# Patient Record
Sex: Female | Born: 1985 | Hispanic: Yes | Marital: Single | Smoking: Never smoker
Health system: Southern US, Community
[De-identification: ages and names within clinical notes are randomized; demographics above are authoritative.]

## PROBLEM LIST (undated history)

## (undated) ENCOUNTER — Inpatient Hospital Stay: Payer: Self-pay

## (undated) DIAGNOSIS — F329 Major depressive disorder, single episode, unspecified: Secondary | ICD-10-CM

## (undated) DIAGNOSIS — O99213 Obesity complicating pregnancy, third trimester: Secondary | ICD-10-CM

## (undated) DIAGNOSIS — Z8759 Personal history of other complications of pregnancy, childbirth and the puerperium: Secondary | ICD-10-CM

## (undated) DIAGNOSIS — F32A Depression, unspecified: Secondary | ICD-10-CM

## (undated) HISTORY — DX: Personal history of other complications of pregnancy, childbirth and the puerperium: Z87.59

## (undated) HISTORY — DX: Depression, unspecified: F32.A

## (undated) HISTORY — DX: Major depressive disorder, single episode, unspecified: F32.9

---
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
## 2006-01-04 ENCOUNTER — Ambulatory Visit: Payer: Self-pay | Admitting: Family Medicine

## 2012-01-29 ENCOUNTER — Emergency Department: Payer: Self-pay | Admitting: Emergency Medicine

## 2012-01-29 LAB — LIPASE, BLOOD: Lipase: 146 U/L (ref 73–393)

## 2012-01-29 LAB — URINALYSIS, COMPLETE
Bilirubin,UR: NEGATIVE
Glucose,UR: NEGATIVE mg/dL (ref 0–75)
Ketone: NEGATIVE
RBC,UR: 40 /HPF (ref 0–5)
Squamous Epithelial: NONE SEEN
WBC UR: 489 /HPF (ref 0–5)

## 2012-01-29 LAB — COMPREHENSIVE METABOLIC PANEL
Albumin: 3.3 g/dL — ABNORMAL LOW (ref 3.4–5.0)
Anion Gap: 7 (ref 7–16)
BUN: 13 mg/dL (ref 7–18)
Bilirubin,Total: 0.4 mg/dL (ref 0.2–1.0)
Chloride: 107 mmol/L (ref 98–107)
Creatinine: 0.81 mg/dL (ref 0.60–1.30)
Potassium: 4 mmol/L (ref 3.5–5.1)
SGOT(AST): 19 U/L (ref 15–37)
SGPT (ALT): 17 U/L
Total Protein: 8 g/dL (ref 6.4–8.2)

## 2012-01-29 LAB — CBC
HCT: 37.8 % (ref 35.0–47.0)
MCH: 29.2 pg (ref 26.0–34.0)
RBC: 4.16 10*6/uL (ref 3.80–5.20)
RDW: 14.3 % (ref 11.5–14.5)
WBC: 13.1 10*3/uL — ABNORMAL HIGH (ref 3.6–11.0)

## 2012-01-29 LAB — PREGNANCY, URINE: Pregnancy Test, Urine: NEGATIVE m[IU]/mL

## 2012-01-31 ENCOUNTER — Emergency Department: Payer: Self-pay | Admitting: Unknown Physician Specialty

## 2012-01-31 LAB — URINALYSIS, COMPLETE
Ketone: NEGATIVE
Nitrite: NEGATIVE
Ph: 6 (ref 4.5–8.0)
WBC UR: 31 /HPF (ref 0–5)

## 2012-01-31 LAB — CBC
HCT: 40.2 % (ref 35.0–47.0)
MCH: 28.9 pg (ref 26.0–34.0)
MCV: 90 fL (ref 80–100)
Platelet: 286 10*3/uL (ref 150–440)
RBC: 4.45 10*6/uL (ref 3.80–5.20)
WBC: 11.9 10*3/uL — ABNORMAL HIGH (ref 3.6–11.0)

## 2012-01-31 LAB — WET PREP, GENITAL

## 2012-01-31 LAB — COMPREHENSIVE METABOLIC PANEL
Albumin: 3.2 g/dL — ABNORMAL LOW (ref 3.4–5.0)
Bilirubin,Total: 0.5 mg/dL (ref 0.2–1.0)
Calcium, Total: 8.4 mg/dL — ABNORMAL LOW (ref 8.5–10.1)
Creatinine: 0.67 mg/dL (ref 0.60–1.30)
EGFR (African American): >60
EGFR (Non-African Amer.): >60
Potassium: 3.7 mmol/L (ref 3.5–5.1)
SGOT(AST): 20 U/L (ref 15–37)
SGPT (ALT): 19 U/L
Sodium: 137 mmol/L (ref 136–145)
Total Protein: 8.4 g/dL — ABNORMAL HIGH (ref 6.4–8.2)

## 2012-02-02 LAB — URINE CULTURE

## 2012-02-06 LAB — CULTURE, BLOOD (SINGLE)

## 2014-08-31 NOTE — L&D Delivery Note (Signed)
Delivery Summary for [facility]  Labor Events:   Preterm labor:   Rupture date:   Rupture time:   Rupture type: Artificial  Fluid Color: Clear  Induction:   Augmentation:   Complications:   Cervical ripening:          Delivery:   Episiotomy:   Lacerations:   Repair suture:   Repair # of packets:   Blood loss (ml): 200   Information for the patient's newborn:  Garnell, Begeman [[id number]]    Delivery 03/27/2015 1:27 PM by  VBAC, Spontaneous Sex:  female Gestational Age: [redacted]w[redacted]d Delivery Clinician:  Hildred Laser Living?: Yes        APGARS  One minute Five minutes Ten minutes  Skin color: 1   1      Heart rate: 2   2      Grimace: 2   2      Muscle tone: 1   2      Breathing: 2   2      Totals: 8  9      Presentation/position: Vertex  Right Occiput Anterior Resuscitation: None  Cord information: 3 vessels   Disposition of cord blood:     Blood gases sent?  Complications:   Placenta: Delivered: 03/27/2015 1:32 PM  Spontaneous  Intact appearance Newborn Measurements: Weight: 5 lb 15.9 oz (2720 g)  Height: 18.5"  Head circumference:    Chest circumference:    Other providers:    Additional  information: Forceps:   Vacuum:   Breech:   Observed anomalies       Delivery Summary for [facility]  Labor Events:   Preterm labor:   Rupture date:   Rupture time:   Rupture type: Artificial  Fluid Color: Clear  Induction:   Augmentation:   Complications:   Cervical ripening:          Delivery:   Episiotomy:   Lacerations:   Repair suture:   Repair # of packets:   Blood loss (ml): 200   Information for the patient's newborn:  Jeliyah, [place] [[id number]]    Delivery 03/27/2015 1:27 PM by  VBAC, Spontaneous Sex:  female Gestational Age: [redacted]w[redacted]d Delivery Clinician:  Hildred Laser Living?: Yes        APGARS  One minute Five minutes Ten minutes  Skin color: 1   1      Heart rate: 2   2      Grimace: 2   2       Muscle tone: 1   2      Breathing: 2   2      Totals: 8  9      Presentation/position: Vertex  Right Occiput Anterior Resuscitation: None  Cord information: 3 vessels   Disposition of cord blood:     Blood gases sent?  Complications:   Placenta: Delivered: 03/27/2015 1:32 PM  Spontaneous  Intact appearance Newborn Measurements: Weight: 5 lb 15.9 oz (2720 g)  Height: 18.5"  Head circumference:    Chest circumference:    Other providers:    Additional  information: Forceps:   Vacuum:   Breech:   Observed anomalies        Delivery Summary for [facility]  Labor Events:   Preterm labor:   Rupture date:   Rupture time:   Rupture type: Artificial  Fluid Color: Clear  Induction:   Augmentation:   Complications:   Cervical ripening:  Delivery:   Episiotomy:   Lacerations:   Repair suture:   Repair # of packets:   Blood loss (ml): 200   Information for the patient's newborn:  Diania, Co [[id number]]    Delivery 03/27/2015 1:27 PM by  VBAC, Spontaneous Sex:  female Gestational Age: [redacted]w[redacted]d Delivery Clinician:  Hildred Laser Living?: Yes        APGARS  One minute Five minutes Ten minutes  Skin color: 1   1      Heart rate: 2   2      Grimace: 2   2      Muscle tone: 1   2      Breathing: 2   2      Totals: 8  9      Presentation/position: Vertex  Right Occiput Anterior Resuscitation: None  Cord information: 3 vessels   Disposition of cord blood:     Blood gases sent?  Complications:   Placenta: Delivered: 03/27/2015 1:32 PM  Spontaneous  Intact appearance Newborn Measurements: Weight: 5 lb 15.9 oz (2720 g)  Height: 18.5"  Head circumference:    Chest circumference:    Other providers:    Additional  information: Forceps:   Vacuum:   Breech:   Observed anomalies         Delivery Note At 1:27 PM a viable female was delivered via VBAC, Spontaneous (Presentation: Right Occiput Anterior).  APGAR: 8, 9; weight 5 lb 15.9  oz (2720 g).   Placenta status: Intact, Spontaneous.  Cord: 3 vessels with the following complications: none.  Cord pH: not obtained  Anesthesia: Other  Episiotomy:   Lacerations:  None Suture Repair: None Est. Blood Loss (mL): 200  Mom to postpartum.  Baby to Couplet care / Skin to Skin.    Hildred Laser 03/27/2015, 7:39 PM

## 2014-09-26 LAB — OB RESULTS CONSOLE HGB/HCT, BLOOD: Hemoglobin: 12.3 g/dL

## 2014-09-27 LAB — OB RESULTS CONSOLE VARICELLA ZOSTER ANTIBODY, IGG: Varicella: IMMUNE

## 2014-09-27 LAB — OB RESULTS CONSOLE HIV ANTIBODY (ROUTINE TESTING): HIV: NONREACTIVE

## 2014-09-27 LAB — OB RESULTS CONSOLE ABO/RH: RH Type: POSITIVE

## 2014-09-27 LAB — OB RESULTS CONSOLE RUBELLA ANTIBODY, IGM: Rubella: IMMUNE

## 2014-09-27 LAB — OB RESULTS CONSOLE HEPATITIS B SURFACE ANTIGEN: Hepatitis B Surface Ag: NEGATIVE

## 2014-09-28 ENCOUNTER — Ambulatory Visit: Payer: Self-pay | Admitting: Advanced Practice Midwife

## 2014-10-02 LAB — OB RESULTS CONSOLE GC/CHLAMYDIA
Chlamydia: NEGATIVE
Gonorrhea: NEGATIVE

## 2014-11-20 ENCOUNTER — Ambulatory Visit: Payer: Self-pay | Admitting: Advanced Practice Midwife

## 2014-12-24 ENCOUNTER — Other Ambulatory Visit: Payer: Self-pay | Admitting: Advanced Practice Midwife

## 2014-12-24 DIAGNOSIS — O0992 Supervision of high risk pregnancy, unspecified, second trimester: Secondary | ICD-10-CM

## 2015-01-10 LAB — OB RESULTS CONSOLE RPR: RPR: NONREACTIVE

## 2015-01-11 ENCOUNTER — Other Ambulatory Visit: Payer: Self-pay | Admitting: Advanced Practice Midwife

## 2015-01-11 DIAGNOSIS — E669 Obesity, unspecified: Secondary | ICD-10-CM

## 2015-01-11 DIAGNOSIS — Z8759 Personal history of other complications of pregnancy, childbirth and the puerperium: Secondary | ICD-10-CM

## 2015-01-14 ENCOUNTER — Ambulatory Visit
Admission: RE | Admit: 2015-01-14 | Discharge: 2015-01-14 | Disposition: A | Discharge status: Home / Self Care | Payer: Self-pay | Source: Ambulatory Visit | Attending: Advanced Practice Midwife | Admitting: Advanced Practice Midwife

## 2015-01-14 DIAGNOSIS — Z8759 Personal history of other complications of pregnancy, childbirth and the puerperium: Secondary | ICD-10-CM

## 2015-01-14 DIAGNOSIS — O0992 Supervision of high risk pregnancy, unspecified, second trimester: Secondary | ICD-10-CM | POA: Insufficient documentation

## 2015-01-14 DIAGNOSIS — E669 Obesity, unspecified: Secondary | ICD-10-CM

## 2015-02-04 ENCOUNTER — Other Ambulatory Visit: Payer: Self-pay | Admitting: Advanced Practice Midwife

## 2015-02-04 ENCOUNTER — Observation Stay
Admission: RE | Admit: 2015-02-04 | Discharge: 2015-02-04 | Disposition: A | Discharge status: Home / Self Care | Payer: Self-pay | Source: Ambulatory Visit | Attending: Obstetrics and Gynecology | Admitting: Obstetrics and Gynecology

## 2015-02-04 ENCOUNTER — Ambulatory Visit: Admission: RE | Admit: 2015-02-04 | Payer: Self-pay | Source: Ambulatory Visit | Admitting: Obstetrics and Gynecology

## 2015-02-04 DIAGNOSIS — O3421 Maternal care for scar from previous cesarean delivery: Secondary | ICD-10-CM | POA: Insufficient documentation

## 2015-02-04 DIAGNOSIS — O365930 Maternal care for other known or suspected poor fetal growth, third trimester, not applicable or unspecified: Principal | ICD-10-CM | POA: Insufficient documentation

## 2015-02-04 DIAGNOSIS — O99210 Obesity complicating pregnancy, unspecified trimester: Secondary | ICD-10-CM

## 2015-02-04 DIAGNOSIS — IMO0002 Reserved for concepts with insufficient information to code with codable children: Secondary | ICD-10-CM

## 2015-02-04 DIAGNOSIS — Z3689 Encounter for other specified antenatal screening: Secondary | ICD-10-CM

## 2015-02-04 DIAGNOSIS — Z3A00 Weeks of gestation of pregnancy not specified: Secondary | ICD-10-CM | POA: Insufficient documentation

## 2015-02-04 HISTORY — DX: Obesity complicating pregnancy, third trimester: O99.213

## 2015-02-04 NOTE — Progress Notes (Signed)
Spoke with Dr. Greggory Keenefrancesco about reactive NST. G4P3, 30 weeks and 4 days, EDC 04/10/15, baseline 130, 15x15, accels, no decels, CAT 1, reactive strip.  MD ordered pt to be discharge with weekly NSTs.

## 2015-02-04 NOTE — Discharge Instructions (Signed)
Cardiotocografa en reposo (Nonstress Test)  La cardiotocografa en reposo es un procedimiento que controla los latidos del corazn del feto. El estudio controla los latidos del corazn cuando el feto est en reposo y [place] Ranchmientras se [place] Springsmueve. En un feto sano, habr un aumento de la frecuencia cardaca fetal cuando se mueve o patea. La frecuencia cardaca disminuye en reposo. Este examen ayuda a determinar si el feto est sano. El mdico va a revisar una serie de patrones en el ritmo cardaco para asegurarse de que el beb est creciendo bien. Si hay alguna preocupacin, el mdico puede ordenar pruebas adicionales o puede sugerir otro curso de accin. Este estudio se hace en el tercer trimestre del embarazo y puede ayudar a determinar si es seguro y Passenger transport managernecesario realizar un parto anticipado. Las razones ms comunes para Paediatric nursehacer este examen son:   Ya ha pasado la fecha del [place]parto.  Tiene un embarazo de Conservator, museum/galleryalto riesgo.  Siente menos movimientos que lo habitual.  Ha perdido un embarazo en el pasado.  El mdico sospecha problemas en el desarrollo fetal.  Tiene mucho o muy poco lquido amnitico. ANTES DEL PROCEDIMIENTO   Coma un alimento justo antes del examen o como se lo indique su mdico. Los alimentos ayudan a Risk managerestimular los movimientos fetales.  Vaya al bao justo antes del [place]angle[place] Parkestudio. PROCEDIMIENTO   Le colocarn dos cinturones alrededor del abdomen. Estos cinturones tienen monitores conectados a ellos. Uno registra la frecuencia cardaca fetal y el otro las contracciones uterinas.  Le indicarn que se acueste de lado o permanezca sentada en posicin vertical.  Le darn un pulsador para presionar cuando sienta el movimiento.  Se escucharn los latidos cardacos del feto y se observar en una pantalla. Los latidos cardacos se registran en un papel.  Si el feto parece estar dormido, se le puede pedir que beba un poco de jugo o soda, presione suavemente su abdomen, o haga algo de ruido para  despertarlo. DESPUS DEL PROCEDIMIENTO  El mdico le Hershey Companyexplicar los resultados del estudio y le har recomendaciones para el futuro prximo.  Document Released: 08/17/2005 Document Revised: 01/01/2014 [facility] Patient Information 2015 [place]ExitCare, [place]. This information is not intended to replace advice given to you by your health care provider. Make sure you discuss any questions you have with your health care provider.

## 2015-02-04 NOTE — H&P (Signed)
Jenny Brooks is a 29 y.o. female presenting for NST. History OB History    Gravida Para Term Preterm AB TAB SAB Ectopic Multiple Living   4 3 3  0 0 0 0 0 0 0     Past Medical History  Diagnosis Date  . Medical history non-contributory   . Obesity affecting pregnancy in third trimester    Past Surgical History  Procedure Laterality Date  . Cesarean section    . No past surgeries     Family History: family history includes Heart disease in her father. Social History:  reports that she has never smoked. She does not have any smokeless tobacco history on file. She reports that she does not drink alcohol or use illicit drugs.   Prenatal Transfer Tool  Maternal Diabetes: No Maternal Substance Abuse:  No Significant Maternal Medications:  None Significant Maternal Lab Results:  None Other Comments:  A/P Testing for Obesity  ROS    Blood pressure 105/59, pulse 45, temperature 98.3 F (36.8 C), temperature source Oral, resp. rate 18, height 5\' 3"  (1.6 m), weight 100.245 kg (221 lb), last menstrual period 07/25/2014. Exam Physical Exam   NST INTERPRETATION:  Indications: Obesity  Mode: External Baseline Rate (A): 138 bpm Variability: Moderate Accelerations: 15 x 15 Decelerations: None Nonstress Test Interpretation: Reactive Overall Impression: Reassuring for gestational age Contraction Frequency (min): none   Impression: reactive   Plan:  1. Weekly NST 2. Routine Prenatal care at [facility].    Herold HarmsMartin A Defrancesco, MD Prenatal labs: ABO, Rh:   Antibody:   Rubella:   RPR:    HBsAg:    HIV:    GBS:     Assessment/Plan:  Plan:  1. Weekly NST 2. Routine Prenatal care at [facility].   Daphine DeutscherMartin A Defrancesco 02/04/2015, 4:04 PM

## 2015-02-11 ENCOUNTER — Ambulatory Visit
Admission: RE | Admit: 2015-02-11 | Discharge: 2015-02-11 | Disposition: A | Discharge status: Home / Self Care | Payer: Self-pay | Source: Ambulatory Visit | Attending: Advanced Practice Midwife | Admitting: Advanced Practice Midwife

## 2015-02-11 ENCOUNTER — Observation Stay
Admission: RE | Admit: 2015-02-11 | Discharge: 2015-02-11 | Disposition: A | Discharge status: Home / Self Care | Payer: Self-pay | Attending: Obstetrics and Gynecology | Admitting: Obstetrics and Gynecology

## 2015-02-11 ENCOUNTER — Encounter: Payer: Self-pay | Admitting: *Deleted

## 2015-02-11 DIAGNOSIS — Z3A36 36 weeks gestation of pregnancy: Secondary | ICD-10-CM | POA: Insufficient documentation

## 2015-02-11 DIAGNOSIS — Z36 Encounter for antenatal screening of mother: Secondary | ICD-10-CM | POA: Insufficient documentation

## 2015-02-11 DIAGNOSIS — O365990 Maternal care for other known or suspected poor fetal growth, unspecified trimester, not applicable or unspecified: Secondary | ICD-10-CM | POA: Insufficient documentation

## 2015-02-11 DIAGNOSIS — IMO0002 Reserved for concepts with insufficient information to code with codable children: Secondary | ICD-10-CM

## 2015-02-11 MED ORDER — ACETAMINOPHEN 325 MG PO TABS: 650.0000 mg | ORAL_TABLET | ORAL | Status: DC | PRN

## 2015-02-11 NOTE — Discharge Instructions (Signed)
Restriccin del crecimiento intrauterino (Intrauterine Growth Restriction) Se refiere al crecimiento deficiente de un beb en un momento del embarazo o al nacer. No debe confundirse con "pequeo para la edad gestacional", que significa que el peso del beb al nacer est en el extremo ms bajo) menos del 10% de la curva de pesos normales.  CAUSAS Problemas mdicos en la madre:  Presin arterial elevada.  Enfermedad renal, pulmonar o cardaca.  Diabetes con arteriosclerosis.  Hemoglobinopatas, enfermedades de [employer].  Sndrome de anticuerpos antifosfolpidos, un trastorno del sistema inmunolgico. Otras causas:  Consumo de drogas, hbito de fumar e ingesta de alcohol excesiva.  Enfermedades de la placenta.  Embarazo de gemelos o ms bebs.  Desnutricin  Infecciones.  Problemas genticos.  Embarazo a los 16 aos o menos, o en mujeres de ms de 35 aos.  Exposicin a sustancias qumicas. SNTOMAS  tero ms pequeo que lo normal, al medir su tamao sobre el abdomen.  Si al medir por medio de una ecografa de la circunferencia ceflica del feto, la circunferencia abdominal, el dimetro del rea biparietal (lados) y el largo del fmur se obtienen valores menores a los normales, hay indicios de restriccin del desarrollo intrauterino. RIESGOS Y COMPLICACIONES  Muerte fetal dentro del tero o beb nacido muerto.  Insuficiencia de lquido amnitico en la bolsa del beb (oligohidramnios).  Problemas en la frecuencia cardaca fetal. Esto ocasiona ms partos por cesrea.  Bajas puntuaciones de Apgar (evaluacin del estado del beb en el momento del nacimiento).  Aumento en la acidez de la sangre del beb (acidosis). Los nios de bajo peso al nacer tambin pueden sufrir las siguientes complicaciones:  Bajo nivel de Banker.  Aumento de la bilirrubina.  Baja Arts development officer (hipotermia).  Bajas puntuaciones de Apgar, convulsiones, muerte fetal y [employer]  al [employer]. TRATAMIENTO  Controles y seguimiento estrechos del feto durante el [place].  Tratamiento del las infecciones, si existieran.  Tratamiento y control de las enfermedades existentes.  Observacin del estado del feto con pruebas de no estrs, pruebas de estrs por contracciones y perfil biofsico del feto.  La ecografa Doppler (medicin del flujo de la arteria umbilical) disminuye el riesgo de muerte fetal y [employer] al Psychologist, clinical, permitiendo un parto anticipado en caso de ser anormal. INSTRUCCIONES PARA EL CUIDADO DOMICILIARIO  Siga los consejos y las instrucciones de su mdico, y cumpla con todos los controles prenatales.  Descanse y duerma lo suficiente.  Consuma una dieta balanceada y tome todas sus suplementos de vitaminas y minerales.  No haga un uso excesivo de su energa en actividades fsicas, Aleen Campi o actividades domsticas extenuantes.  No practique actividad fsica excepto que el mdico la autorice.  No fume, no beba alcohol, ni consuma drogas.  Evite el contacto con sustancias qumicas, como pesticidas. SOLICITE ATENCIN MDICA SI:  La temperatura se eleva por encima de 100 F (37.9 C).  No siente los movimientos del beb o estos son menos frecuentes.  Tiene una prdida de lquido por la vagina.  Presenta una hemorragia vaginal abundante.  Siente dolor abdominal.  Tiene contracciones uterinas. Document Released: 07/30/2008 Document Revised: 11/09/2011 [facility] Patient Information 2015 [place], [place]. This information is not intended to replace advice given to you by your health care provider. Make sure you discuss any questions you have with your health care provider.

## 2015-02-11 NOTE — OB Triage Note (Signed)
Present for Scheduled NST 

## 2015-02-18 ENCOUNTER — Observation Stay
Admission: RE | Admit: 2015-02-18 | Discharge: 2015-02-18 | Disposition: A | Discharge status: Home / Self Care | Payer: Self-pay | Attending: Obstetrics and Gynecology | Admitting: Obstetrics and Gynecology

## 2015-02-18 ENCOUNTER — Encounter: Payer: Self-pay | Admitting: *Deleted

## 2015-02-18 DIAGNOSIS — O99210 Obesity complicating pregnancy, unspecified trimester: Principal | ICD-10-CM | POA: Insufficient documentation

## 2015-02-18 NOTE — Discharge Instructions (Signed)
Return to Labor and delivery next Monday for NST

## 2015-02-18 NOTE — OB Triage Note (Addendum)
Pt here for NST due to high risk pregnancy related to prior c/s and obesity. Hx of SGA babies    Dr. Greggory Keen on unit, strip reveiwed. Pt to be dc'ed to home.

## 2015-02-19 NOTE — Progress Notes (Signed)
NST INTERPRETATION:  Indications: Obesity; prior C-section  Mode: External Baseline Rate (A): 130 bpm Variability: Moderate Accelerations: 15 x 15 Decelerations: None     Impression: reactive NST  Plan:  1.  Fetal well-being documented. 2.  Fetal kick counts twice a day. 3.  Continue weekly NST's.  Herold Harms, MD

## 2015-03-06 ENCOUNTER — Other Ambulatory Visit: Payer: Self-pay | Admitting: Advanced Practice Midwife

## 2015-03-06 DIAGNOSIS — IMO0002 Reserved for concepts with insufficient information to code with codable children: Secondary | ICD-10-CM

## 2015-03-06 LAB — OB RESULTS CONSOLE GBS: GBS: NEGATIVE

## 2015-03-06 LAB — OB RESULTS CONSOLE GC/CHLAMYDIA
Chlamydia: NEGATIVE
GC PROBE AMP, GENITAL: NEGATIVE

## 2015-03-11 ENCOUNTER — Observation Stay
Admission: RE | Admit: 2015-03-11 | Discharge: 2015-03-11 | Disposition: A | Discharge status: Home / Self Care | Payer: Self-pay | Attending: Obstetrics and Gynecology | Admitting: Obstetrics and Gynecology

## 2015-03-11 ENCOUNTER — Ambulatory Visit
Admission: RE | Admit: 2015-03-11 | Discharge: 2015-03-11 | Disposition: A | Discharge status: Home / Self Care | Payer: MEDICAID | Source: Ambulatory Visit | Attending: Advanced Practice Midwife | Admitting: Advanced Practice Midwife

## 2015-03-11 DIAGNOSIS — Z3689 Encounter for other specified antenatal screening: Secondary | ICD-10-CM

## 2015-03-11 DIAGNOSIS — Z3A35 35 weeks gestation of pregnancy: Secondary | ICD-10-CM | POA: Insufficient documentation

## 2015-03-11 DIAGNOSIS — IMO0002 Reserved for concepts with insufficient information to code with codable children: Secondary | ICD-10-CM

## 2015-03-11 DIAGNOSIS — Z3493 Encounter for supervision of normal pregnancy, unspecified, third trimester: Principal | ICD-10-CM | POA: Insufficient documentation

## 2015-03-11 DIAGNOSIS — Z3A34 34 weeks gestation of pregnancy: Secondary | ICD-10-CM | POA: Insufficient documentation

## 2015-03-13 ENCOUNTER — Other Ambulatory Visit: Payer: Self-pay

## 2015-03-13 ENCOUNTER — Other Ambulatory Visit: Payer: Self-pay | Admitting: Advanced Practice Midwife

## 2015-03-13 DIAGNOSIS — O288 Other abnormal findings on antenatal screening of mother: Secondary | ICD-10-CM

## 2015-03-18 ENCOUNTER — Ambulatory Visit
Admission: RE | Admit: 2015-03-18 | Discharge: 2015-03-18 | Disposition: A | Discharge status: Home / Self Care | Payer: MEDICAID | Source: Ambulatory Visit | Attending: Advanced Practice Midwife | Admitting: Advanced Practice Midwife

## 2015-03-18 DIAGNOSIS — O288 Other abnormal findings on antenatal screening of mother: Secondary | ICD-10-CM

## 2015-03-18 DIAGNOSIS — Z36 Encounter for antenatal screening of mother: Secondary | ICD-10-CM | POA: Insufficient documentation

## 2015-03-18 DIAGNOSIS — Z3A34 34 weeks gestation of pregnancy: Secondary | ICD-10-CM | POA: Insufficient documentation

## 2015-03-21 ENCOUNTER — Encounter: Payer: Self-pay | Admitting: Obstetrics and Gynecology

## 2015-03-27 ENCOUNTER — Inpatient Hospital Stay
Admission: EM | Admit: 2015-03-27 | Discharge: 2015-03-29 | DRG: 775 | Disposition: A | Discharge status: Home / Self Care | Payer: Medicaid Other | Attending: Obstetrics and Gynecology | Admitting: Obstetrics and Gynecology

## 2015-03-27 ENCOUNTER — Encounter: Payer: Self-pay | Admitting: Obstetrics and Gynecology

## 2015-03-27 DIAGNOSIS — Z6838 Body mass index (BMI) 38.0-38.9, adult: Secondary | ICD-10-CM

## 2015-03-27 DIAGNOSIS — O3421 Maternal care for scar from previous cesarean delivery: Principal | ICD-10-CM | POA: Diagnosis present

## 2015-03-27 DIAGNOSIS — Z3483 Encounter for supervision of other normal pregnancy, third trimester: Secondary | ICD-10-CM

## 2015-03-27 DIAGNOSIS — E669 Obesity, unspecified: Secondary | ICD-10-CM | POA: Diagnosis present

## 2015-03-27 DIAGNOSIS — Z3A38 38 weeks gestation of pregnancy: Secondary | ICD-10-CM | POA: Diagnosis present

## 2015-03-27 DIAGNOSIS — Z8249 Family history of ischemic heart disease and other diseases of the circulatory system: Secondary | ICD-10-CM

## 2015-03-27 DIAGNOSIS — O34219 Maternal care for unspecified type scar from previous cesarean delivery: Secondary | ICD-10-CM

## 2015-03-27 DIAGNOSIS — O99210 Obesity complicating pregnancy, unspecified trimester: Secondary | ICD-10-CM | POA: Diagnosis present

## 2015-03-27 LAB — CBC
HCT: 40 % (ref 35.0–47.0)
Hemoglobin: 12.9 g/dL (ref 12.0–16.0)
MCH: 28.5 pg (ref 26.0–34.0)
MCHC: 32.1 g/dL (ref 32.0–36.0)
MCV: 88.5 fL (ref 80.0–100.0)
PLATELETS: 367 10*3/uL (ref 150–440)
RBC: 4.52 MIL/uL (ref 3.80–5.20)
RDW: 15.3 % — ABNORMAL HIGH (ref 11.5–14.5)
WBC: 13.2 10*3/uL — AB (ref 3.6–11.0)

## 2015-03-27 LAB — TYPE AND SCREEN
ABO/RH(D): O POS
Antibody Screen: NEGATIVE

## 2015-03-27 LAB — ABO/RH: ABO/RH(D): O POS

## 2015-03-27 MED ORDER — MISOPROSTOL 200 MCG PO TABS
ORAL_TABLET | ORAL | Status: AC
  Filled 2015-03-27: qty 4

## 2015-03-27 MED ORDER — DOCUSATE SODIUM 100 MG PO CAPS
100.0000 mg | ORAL_CAPSULE | Freq: Two times a day (BID) | ORAL | Status: DC
  Administered 2015-03-28 – 2015-03-29 (×3): 100 mg via ORAL
  Filled 2015-03-27 (×3): qty 1

## 2015-03-27 MED ORDER — LACTATED RINGERS IV SOLN: 500.0000 mL | INTRAVENOUS | Status: DC | PRN

## 2015-03-27 MED ORDER — PRENATAL MULTIVITAMIN CH
1.0000 | ORAL_TABLET | Freq: Every day | ORAL | Status: DC
  Administered 2015-03-28 – 2015-03-29 (×2): 1 via ORAL
  Filled 2015-03-27 (×2): qty 1

## 2015-03-27 MED ORDER — WITCH HAZEL-GLYCERIN EX PADS: 1.0000 "application " | MEDICATED_PAD | CUTANEOUS | Status: DC | PRN

## 2015-03-27 MED ORDER — OXYTOCIN 40 UNITS IN LACTATED RINGERS INFUSION - SIMPLE MED
INTRAVENOUS | Status: AC
  Administered 2015-03-27: 40 [IU]
  Filled 2015-03-27: qty 1000

## 2015-03-27 MED ORDER — LACTATED RINGERS IV SOLN
INTRAVENOUS | Status: DC
  Administered 2015-03-27: 10:00:00 via INTRAVENOUS

## 2015-03-27 MED ORDER — AMMONIA AROMATIC IN INHA
RESPIRATORY_TRACT | Status: AC
  Filled 2015-03-27: qty 10

## 2015-03-27 MED ORDER — LACTATED RINGERS IV SOLN: INTRAVENOUS | Status: DC

## 2015-03-27 MED ORDER — ONDANSETRON HCL 4 MG/2ML IJ SOLN: 4.0000 mg | INTRAMUSCULAR | Status: DC | PRN

## 2015-03-27 MED ORDER — OXYTOCIN 40 UNITS IN LACTATED RINGERS INFUSION - SIMPLE MED
62.5000 mL/h | INTRAVENOUS | Status: DC
  Administered 2015-03-28: 62.5 mL/h via INTRAVENOUS

## 2015-03-27 MED ORDER — LIDOCAINE HCL (PF) 1 % IJ SOLN
INTRAMUSCULAR | Status: AC
  Filled 2015-03-27: qty 30

## 2015-03-27 MED ORDER — OXYTOCIN BOLUS FROM INFUSION
500.0000 mL | INTRAVENOUS | Status: DC
Start: 2015-03-27 — End: 2015-03-28

## 2015-03-27 MED ORDER — SIMETHICONE 80 MG PO CHEW: 80.0000 mg | CHEWABLE_TABLET | ORAL | Status: DC | PRN

## 2015-03-27 MED ORDER — ACETAMINOPHEN 325 MG PO TABS
650.0000 mg | ORAL_TABLET | ORAL | Status: DC | PRN
  Administered 2015-03-28: 650 mg via ORAL
  Filled 2015-03-27: qty 2

## 2015-03-27 MED ORDER — BUTORPHANOL TARTRATE 1 MG/ML IJ SOLN: 2.0000 mg | INTRAMUSCULAR | Status: DC | PRN

## 2015-03-27 MED ORDER — BENZOCAINE-MENTHOL 20-0.5 % EX AERO: 1.0000 "application " | INHALATION_SPRAY | CUTANEOUS | Status: DC | PRN

## 2015-03-27 MED ORDER — DIPHENHYDRAMINE HCL 25 MG PO CAPS: 25.0000 mg | ORAL_CAPSULE | Freq: Four times a day (QID) | ORAL | Status: DC | PRN

## 2015-03-27 MED ORDER — OXYTOCIN 40 UNITS IN LACTATED RINGERS INFUSION - SIMPLE MED
62.5000 mL/h | INTRAVENOUS | Status: DC | PRN
Start: 2015-03-27 — End: 2015-03-28
  Filled 2015-03-27: qty 1000

## 2015-03-27 MED ORDER — ZOLPIDEM TARTRATE 5 MG PO TABS: 5.0000 mg | ORAL_TABLET | Freq: Every evening | ORAL | Status: DC | PRN

## 2015-03-27 MED ORDER — BUTORPHANOL TARTRATE 1 MG/ML IJ SOLN
2.0000 mg | Freq: Once | INTRAMUSCULAR | Status: AC
  Administered 2015-03-27: 2 mg via INTRAVENOUS

## 2015-03-27 MED ORDER — LANOLIN HYDROUS EX OINT: TOPICAL_OINTMENT | CUTANEOUS | Status: DC | PRN

## 2015-03-27 MED ORDER — CITRIC ACID-SODIUM CITRATE 334-500 MG/5ML PO SOLN: 30.0000 mL | ORAL | Status: DC | PRN

## 2015-03-27 MED ORDER — ACETAMINOPHEN 325 MG PO TABS: 650.0000 mg | ORAL_TABLET | ORAL | Status: DC | PRN

## 2015-03-27 MED ORDER — DIBUCAINE 1 % RE OINT: 1.0000 "application " | TOPICAL_OINTMENT | RECTAL | Status: DC | PRN

## 2015-03-27 MED ORDER — BUTORPHANOL TARTRATE 1 MG/ML IJ SOLN
INTRAMUSCULAR | Status: AC
  Filled 2015-03-27: qty 2

## 2015-03-27 MED ORDER — ONDANSETRON HCL 4 MG/2ML IJ SOLN: 4.0000 mg | Freq: Four times a day (QID) | INTRAMUSCULAR | Status: DC | PRN

## 2015-03-27 MED ORDER — IBUPROFEN 600 MG PO TABS
ORAL_TABLET | ORAL | Status: AC
  Filled 2015-03-27: qty 1

## 2015-03-27 MED ORDER — ONDANSETRON HCL 4 MG PO TABS: 4.0000 mg | ORAL_TABLET | ORAL | Status: DC | PRN

## 2015-03-27 MED ORDER — MEASLES, MUMPS & RUBELLA VAC ~~LOC~~ INJ: 0.5000 mL | INJECTION | Freq: Once | SUBCUTANEOUS | Status: DC

## 2015-03-27 MED ORDER — IBUPROFEN 600 MG PO TABS
600.0000 mg | ORAL_TABLET | Freq: Four times a day (QID) | ORAL | Status: DC
  Administered 2015-03-27 – 2015-03-29 (×7): 600 mg via ORAL
  Filled 2015-03-27 (×7): qty 1

## 2015-03-27 NOTE — H&P (Signed)
Obstetric History and Physical  Jenny Brooks is a 29 y.o. [phone] with IUP at [redacted]w[redacted]d presenting for contractions . Patient states she has been having  irregular, every 5-7 minutes contractions, none vaginal bleeding, intact membranes, with active fetal movement.    Prenatal Course Source of Care: [facility] with onset of care at 9 weeks Pregnancy complications or risks: Patient Active Problem List   Diagnosis Date Noted  . Indication for care in labor and delivery, antepartum 03/27/2015  . Desires VBAC (vaginal birth after cesarean) trial 03/27/2015  . Labor and delivery, indication for care 02/18/2015  . SGA (small for gestational age) 02/11/2015  . NST (non-stress test) reactive 02/04/2015   She plans to breastfeed She desires oral contraceptives (estrogen/progesterone) for postpartum contraception.   Prenatal labs and studies: ABO, Rh: --/--/O POS, O POS (07/27 1016) Antibody: NEG (07/27 1016) Rubella:   Unknown RPR:   Negative HBsAg:   Unknown HIV:   Negative GBS: Negative 1 hr Glucola  normal Genetic screening normal Anatomy US normal  Prenatal Transfer Tool  Maternal Diabetes: No Genetic Screening: Normal Maternal Ultrasounds/Referrals: Normal Fetal Ultrasounds or other Referrals:  None, Other: Serial growth scans Maternal Substance Abuse:  No Significant Maternal Medications:  None Significant Maternal Lab Results: None  Past Medical History  Diagnosis Date  . Obesity affecting pregnancy in third trimester   . Depression   . History of prior pregnancy with SGA newborn     Past Surgical History  Procedure Laterality Date  . Cesarean section      OB History  Gravida Para Term Preterm AB SAB TAB Ectopic Multiple Living  0 0 0 0 0 0 3    # Outcome Date GA Lbr Len/2nd Weight Sex Delivery Anes PTL Lv  4 Current           3 Term 05/14/06   5 lb (2.268 kg) M Vag-Spont   Y  2 Term 12/12/04   5 lb 7 oz (2.466 kg) F Vag-Spont   Y  1 Term 12/12/03   5  lb 6.4 oz (2.449 kg) F CS-LTranv   Y    Obstetric Comments  History of Cesarean section in 2005 for breech; followed by 2 successful VBAC's    History of depression; no meds    History   Social History  . Marital Status: Single    Spouse Name: N/A  . Number of Children: N/A  . Years of Education: N/A   Social History Main Topics  . Smoking status: Never Smoker   . Smokeless tobacco: Not on file  . Alcohol Use: No  . Drug Use: No  . Sexual Activity: No   Other Topics Concern  . Not on file   Social History Narrative    Family History  Problem Relation Age of Onset  . Heart disease Father   . Diabetes Neg Hx   . Cancer Neg Hx     Prescriptions prior to admission  Medication Sig Dispense Refill Last Dose  . Prenatal Vit-Fe Fumarate-FA (PRENATAL MULTIVITAMIN) TABS tablet Take 1 tablet by mouth daily at 12 noon.       No Known Allergies  Review of Systems: Negative except for what is mentioned in HPI.  Physical Exam: BP 121/66 mmHg  Pulse 101  Temp(Src) 98.3 F (36.8 C) (Oral)  Resp 16  Ht  (1.626 m)  Wt 227 lb (102.967 kg)  BMI 38.95 kg/m2  LMP 07/25/2014 (Exact Date) CONSTITUTIONAL: Well-developed, well-nourished  female in no acute distress.  HENT:  Normocephalic, atraumatic, External right and left ear normal. Oropharynx is clear and moist EYES: Conjunctivae and EOM are normal. Pupils are equal, round, and reactive to light. No scleral icterus.  NECK: Normal range of motion, supple, no masses SKIN: Skin is warm and dry. No rash noted. Not diaphoretic. No erythema. No pallor. NEUROLGIC: Alert and oriented to person, place, and time. Normal reflexes, muscle tone coordination. No cranial nerve deficit noted. PSYCHIATRIC: Normal mood and affect. Normal behavior. Normal judgment and thought content. CARDIOVASCULAR: Normal heart rate noted, regular rhythm RESPIRATORY: Effort and breath sounds normal, no problems with respiration noted ABDOMEN: Soft,  nontender, nondistended, gravid. MUSCULOSKELETAL: Normal range of motion. No edema and no tenderness. 2+ distal pulses.  Cervical Exam: Dilatation 4cm   Effacement 80%   Station 0   Presentation: cephalic FHT:  Baseline rate 150 bpm   Variability moderate  Accelerations present   Decelerations none Contractions: Every 3-5 mins   Pertinent Labs/Studies:   Results for orders placed or performed during the hospital encounter of 03/27/15 (from the past 24 hour(s))  Type and screen     Status: None   Collection Time: 03/27/15 10:16 AM  Result Value Ref Range   ABO/RH(D) O POS    Antibody Screen NEG    Sample Expiration 03/30/2015   CBC     Status: Abnormal   Collection Time: 03/27/15 10:16 AM  Result Value Ref Range   WBC 13.2 (H) 3.6 - 11.0 K/uL   RBC 4.52 3.80 - 5.20 MIL/uL   Hemoglobin 12.9 12.0 - 16.0 g/dL   HCT 09.8 11.9 - 14.7 %   MCV 88.5 80.0 - 100.0 fL   MCH 28.5 26.0 - 34.0 pg   MCHC 32.1 32.0 - 36.0 g/dL   RDW 82.9 (H) 56.2 - 13.0 %   Platelets 367 150 - 440 K/uL  ABO/Rh     Status: None   Collection Time: 03/27/15 10:16 AM  Result Value Ref Range   ABO/RH(D) O POS     Assessment : Jenny Brooks is a 29 y.o. G4P3003 at [redacted]w[redacted]d being admitted for labor.  Plan: Labor: Expectant management.  Augmentation as needed, per protocol FWB: Reassuring fetal heart tracing.  GBS negative Delivery plan: Hopeful for vaginal delivery   Hildred Laser, MD [facility]

## 2015-03-27 NOTE — OB Triage Note (Signed)
Presents c/o pain since 0400.  uc's q 5 minutes with srom or bleeding.  Patient is a previous cs but has plans to vbac with this pregnancy.  She has had 2 successful svd after primary cs d/t breech presentation.

## 2015-03-28 ENCOUNTER — Encounter: Payer: Self-pay | Admitting: Obstetrics and Gynecology

## 2015-03-28 LAB — CBC
HEMATOCRIT: 35.3 % (ref 35.0–47.0)
Hemoglobin: 11.5 g/dL — ABNORMAL LOW (ref 12.0–16.0)
MCH: 28.9 pg (ref 26.0–34.0)
MCHC: 32.5 g/dL (ref 32.0–36.0)
MCV: 88.8 fL (ref 80.0–100.0)
Platelets: 294 10*3/uL (ref 150–440)
RBC: 3.98 MIL/uL (ref 3.80–5.20)
RDW: 15.3 % — ABNORMAL HIGH (ref 11.5–14.5)
WBC: 14.7 10*3/uL — ABNORMAL HIGH (ref 3.6–11.0)

## 2015-03-28 LAB — HEPATITIS B SURFACE ANTIGEN: HEP B S AG: NEGATIVE

## 2015-03-28 LAB — RPR: RPR Ser Ql: NONREACTIVE

## 2015-03-28 LAB — RUBELLA SCREEN: Rubella: 2.05 index (ref >0.99)

## 2015-03-28 NOTE — Progress Notes (Signed)
Post Partum Day 1 s/p SVD  Subjective: no complaints, up ad lib, voiding and tolerating PO  Objective: Temp:  [97.6 F (36.4 C)-98.9 F (37.2 C)] 98.2 F (36.8 C) (07/28 0738) Pulse Rate:  [63-101] 72 (07/28 0738) Resp:  [16-20] 18 (07/28 0738) BP: (101-132)/(46-78) 115/73 mmHg (07/28 0738) SpO2:  [100 %] 100 % (07/28 0738) Weight:  [227 lb (102.967 kg)] 227 lb (102.967 kg) (07/27 0908)  Physical Exam:  General: alert and no distress Lochia: appropriate Uterine Fundus: firm Incision: None DVT Evaluation: No evidence of DVT seen on physical exam. No cords or calf tenderness. No significant calf/ankle edema.   Recent Labs  03/27/15 1016 03/28/15 0506  HGB 12.9 11.5*  HCT 40.0 35.3    Assessment/Plan: Plan for discharge tomorrow, Breastfeeding and Contraception OCPs   LOS: 1 day   Hildred Laser 03/28/2015, 7:57 AM

## 2015-03-29 MED ORDER — IBUPROFEN 800 MG PO TABS: 800.0000 mg | ORAL_TABLET | Freq: Three times a day (TID) | ORAL | Status: DC | PRN

## 2015-03-29 MED ORDER — LANOLIN HYDROUS EX OINT: 1.0000 "application " | TOPICAL_OINTMENT | CUTANEOUS | Status: DC | PRN

## 2015-03-29 NOTE — Discharge Instructions (Signed)
General Postpartum Discharge Instructions ° °Do not drink alcohol or take tranquilizers.  °Do not take medicine that has not been prescribed by your doctor.  °Take showers instead of baths until your doctor gives you permission to take baths.  °No sexual intercourse or placement of anything in the vagina for 6 weeks or as instructed by your doctor. °Only take prescription or over-the-counter medicines  for pain, discomfort, or fever as directed by your doctor. Take medicines (antibiotics) that kill germs if they are prescribed for you. °  °Call the office or go to the Emergency Room if:  °You feel sick to your stomach (nauseous).  °You start to throw up (vomit).  °You have trouble eating or drinking.  °You have an oral temperature above 101.  °You have constipation that is not helped by adjusting diet or increasing fluid intake. Pain medicines are a common cause of constipation.  °You have foul smelling vaginal discharge or odor.  °You have bleeding requiring changing more than 1 pad per hour. °You have any other concerns. ° °SEEK IMMEDIATE MEDICAL CARE IF:  °You have persistent dizziness.  °You have difficulty breathing or shortness of breath.  °You have an oral temperature above 102.5, not controlled by medicine.  ° °Call your doctor for increased pain or vaginal bleeding, temperature above 100.4, depression, or concerns.  No strenuous activity or heavy lifting for 6 weeks.  No intercourse, tampons, douching, or enemas for 6 weeks.  No tub baths-showers only.  No driving for 2 weeks or while taking pain medications.  Continue prenatal vitamin and iron.  Increase calories and fluids while breastfeeding.   ° ° °

## 2015-03-29 NOTE — Progress Notes (Signed)
Post Partum Day 2 s/p SVD  Subjective: no complaints, up ad lib, voiding and tolerating PO  Objective: Temp:  [98.1 F (36.7 C)-98.9 F (37.2 C)] 98.1 F (36.7 C) (07/29 0740) Pulse Rate:  [64-88] 64 (07/29 0740) Resp:  [16-20] 16 (07/29 0740) BP: (105-122)/(52-78) 122/65 mmHg (07/29 0740) SpO2:  [100 %] 100 % (07/29 0740)  Physical Exam:  General: alert and no distress Lochia: appropriate Uterine Fundus: firm Incision: None DVT Evaluation: No evidence of DVT seen on physical exam. No cords or calf tenderness. No significant calf/ankle edema.   Recent Labs  03/27/15 1016 03/28/15 0506  HGB 12.9 11.5*  HCT 40.0 35.3    Assessment/Plan: Discharge home, Breastfeeding and Contraception OCPs   LOS: 2 days   Hildred Laser 03/29/2015, 8:35 AM

## 2015-03-29 NOTE — Progress Notes (Signed)
Discharge instructions provided.  Pt and pt's mother verbalize understanding of all instructions and follow-up care.  Prescriptions given.  Pt discharged to home with infant at 1545 on 03/29/15 via wheelchair by volunteer. Reynold Bowen, RN 03/29/2015 4:15 PM

## 2015-03-29 NOTE — Progress Notes (Signed)
Prenatal records indicate that pt received TDaP vaccine on 01/09/15, and that pt is immune to MMR. Reed Breech, RN 03/29/2015 1:33 PM

## 2015-03-29 NOTE — Discharge Summary (Signed)
Obstetric Discharge Summary Reason for Admission: onset of labor Prenatal Procedures: ultrasound Intrapartum Procedures: spontaneous vaginal delivery (successful VBAC) Postpartum Procedures: none Complications-Operative and Postpartum: none HEMOGLOBIN  Date Value Ref Range Status  03/28/2015 11.5* 12.0 - 16.0 g/dL Final  87/56/4332 95.1 g/dL Final   HGB  Date Value Ref Range Status  01/31/2012 12.9 12.0-16.0 g/dL Final   HCT  Date Value Ref Range Status  03/28/2015 35.3 35.0 - 47.0 % Final  01/31/2012 40.2 35.0-47.0 % Final    Physical Exam:  General: alert and no distress Lochia: appropriate Uterine Fundus: firm Incision: none DVT Evaluation: No evidence of DVT seen on physical exam. No cords or calf tenderness. No significant calf/ankle edema.  Discharge Diagnoses: Term Pregnancy-delivered  Discharge Information: Date: 03/29/2015 Activity: pelvic rest x 6 weeks Diet: routine Medications: PNV and Ibuprofen Condition: stable Instructions: refer to practice specific booklet Discharge to: home  Follow-up Information    Follow up with [facility] Department In 6 weeks.   Why:  Postpartum visit   Contact information:   [address] [place] [address]  [place] [address] [phone]       Newborn Data: Live born female  Birth Weight: 5 lb 15.9 oz (2720 g) APGAR: 8, 9  Home with mother.  Jenny Brooks 03/29/2015, 8:36 AM

## 2015-11-08 IMAGING — US US OB FOLLOW-UP
1 series · 13 of 28 positions shown · non-contrast
Comparison: none

ADDENDUM:
The impression should read single viable intrauterine pregnancy at
31 weeks 2 days.
CLINICAL DATA: Four fetal growth.

EXAM:

[Series 1: us ob follow-up · 0.28mm/px · 13 of 41 slices shown]
[im 2/41]
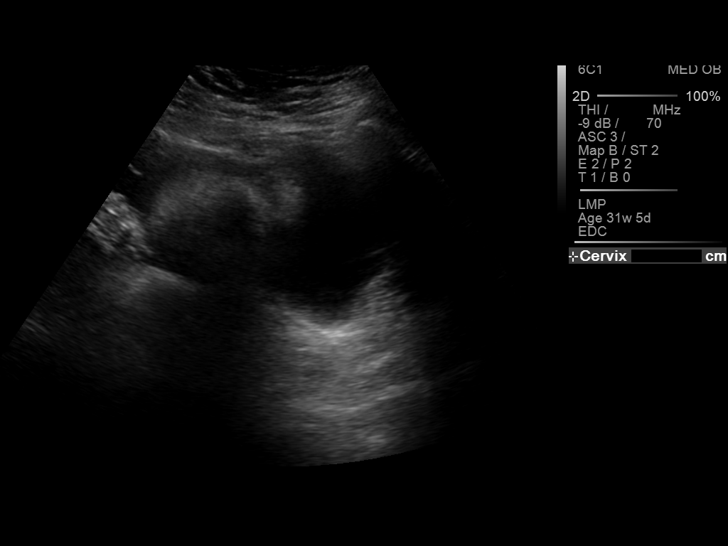
[im 5/41]
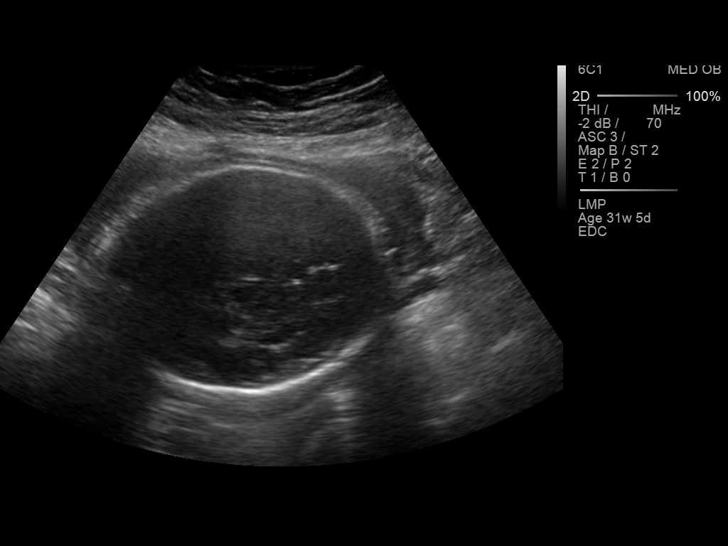
[im 8/41]
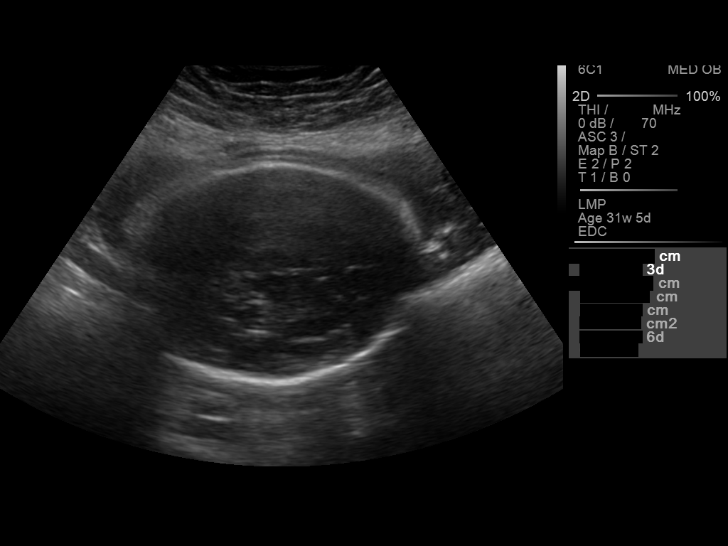
[im 11/41]
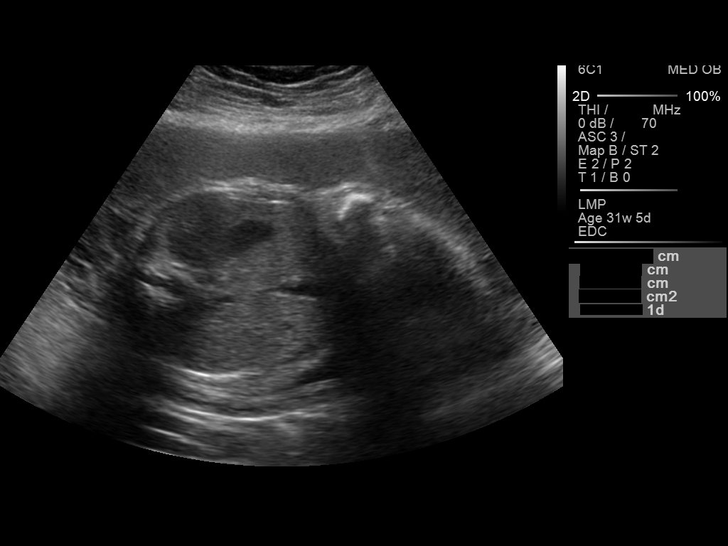
[im 14/41]
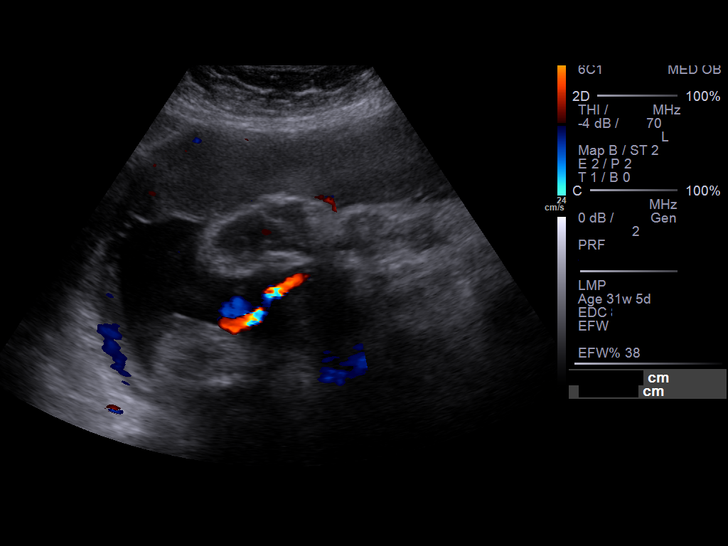
[im 17/41]
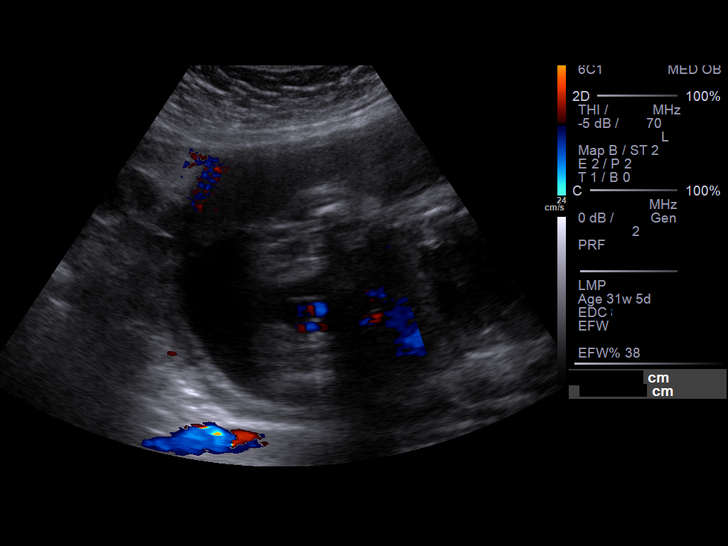
[im 21/41]
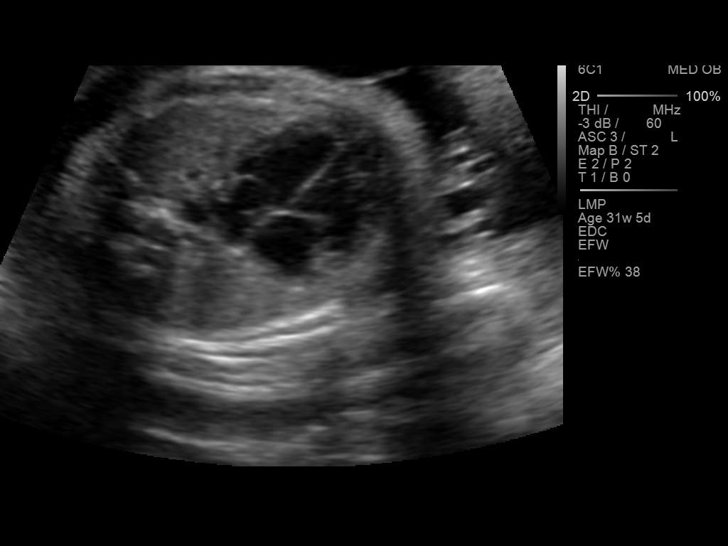
[im 24/41]
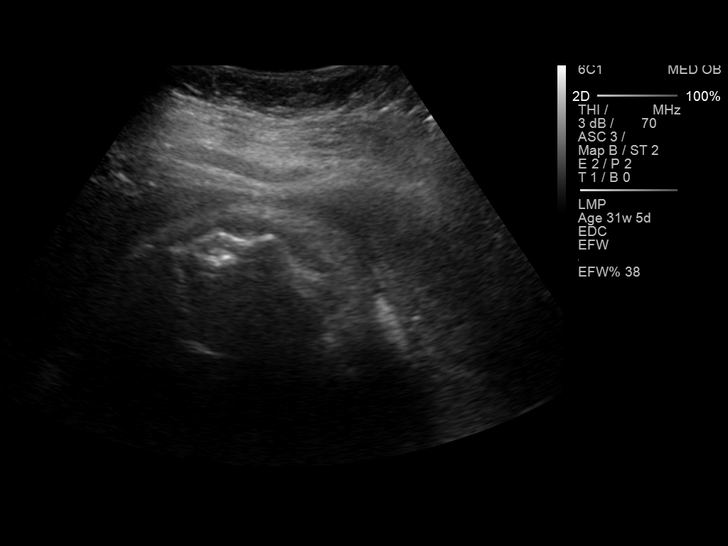
[im 27/41]
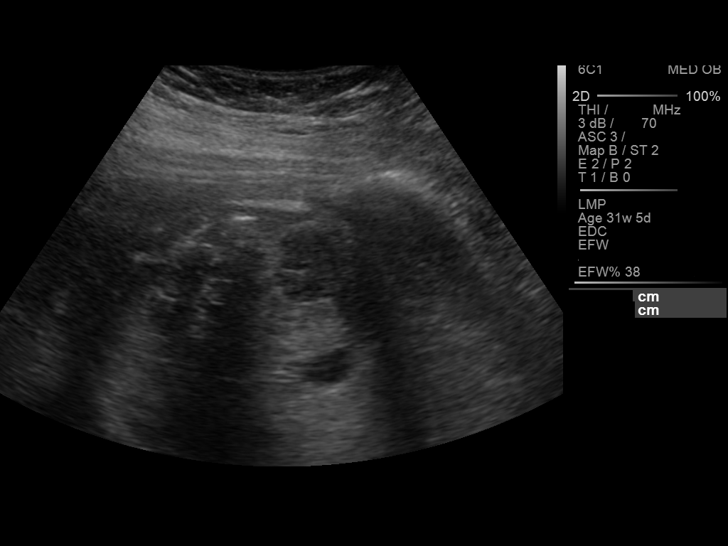
[im 30/41]
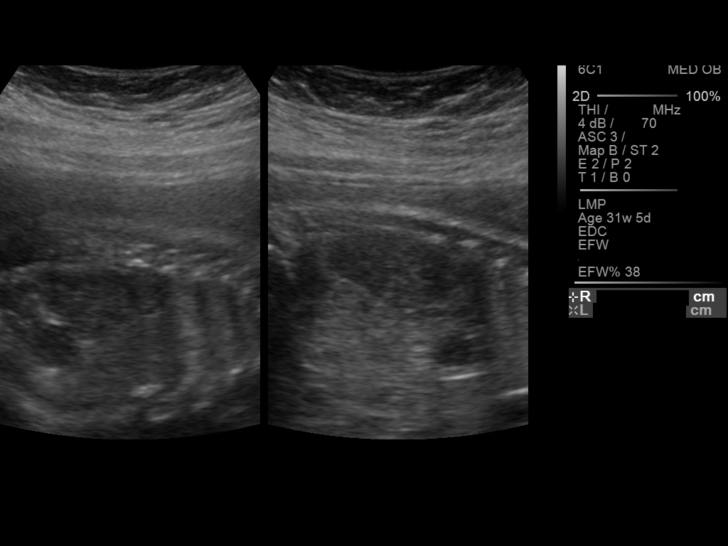
[im 33/41]
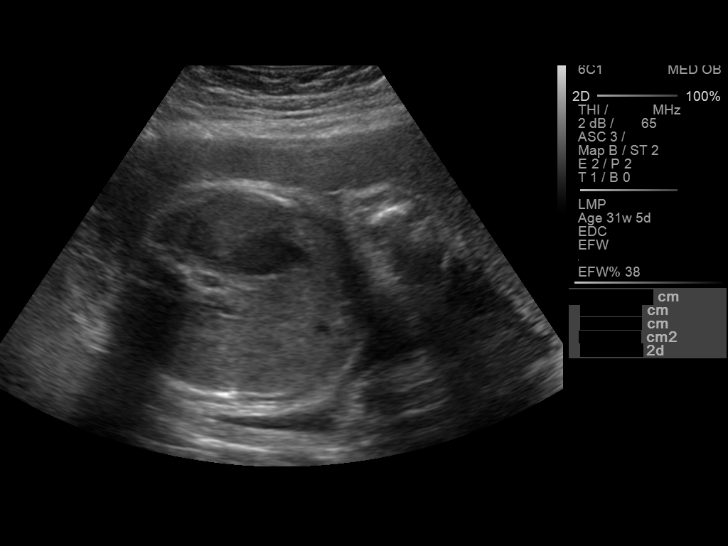
[im 36/41]
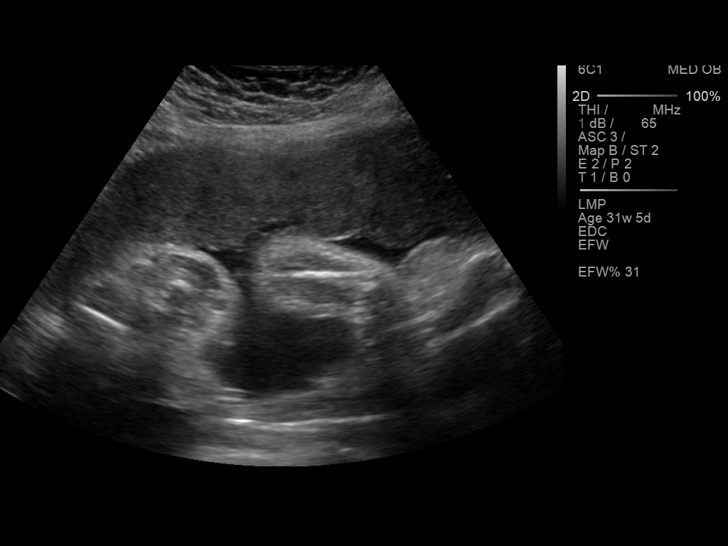
[im 39/41]
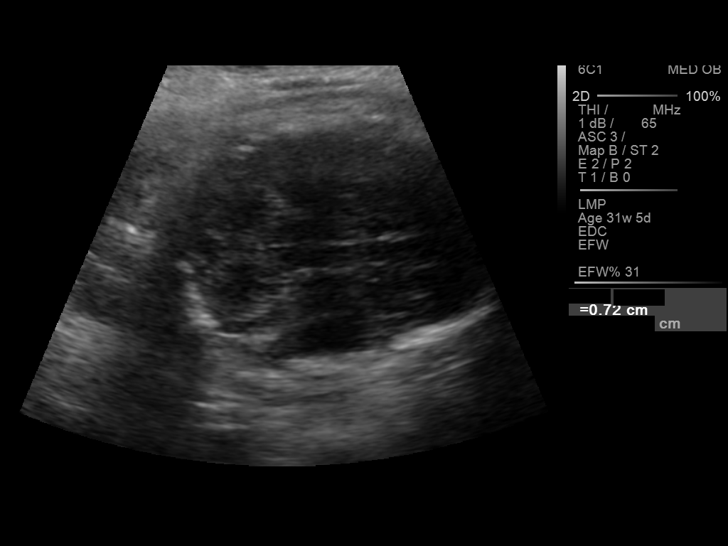

[13 of 28 positions shown; findings below may reference images not displayed]

FINDINGS: Number of Fetuses: 1

Heart Rate:  140 bpm

Movement: Present

Presentation: Cephalic

Previa: No

Placental Location: Anterior

Amniotic Fluid (Subjective): Normal

Amniotic Fluid (Objective):

AFI 14.8 cm (5%ile= 8.8 cm, 95%= 23.8 cm for 31 wks)

FETAL BIOMETRY

BPD:  7.6cm 30w 4d

HC:    28.9cm  31w   6d

AC:   27.6cm  31w   5d

FL:   6.0cm  31w   2d

Current Mean GA: 31w 2d              US EDC: 04/13/2015

Estimated Fetal Weight:  17 74g    31%ile

FETAL ANATOMY

Lateral Ventricles: Previously visualized

Thalami/CSP: Visualized

Posterior Fossa:  The visualized

Nuchal Region: Not visualize

Upper Lip: Previously visualized

Spine: Visualized.

4 Chamber Heart on Left: Visualized

LVOT: Visualized

RVOT: Visualized

Stomach on Left: Visualized

3 Vessel Cord: Previously visualized

Cord Insertion site: Previously visualized

Kidneys: Visualized

Bladder: Visualized

Extremities: Previously visualized. Given the history of poor fetal
growth ultrasound at a tertiary care center should be considered.

Technically difficult due to: Maternal body habitus and fetal
position.

Maternal Findings:

Cervix:  3.9 cm and closed.
IMPRESSION: Single viable intrauterine pregnancy at 39 weeks 2 days.

## 2015-12-13 IMAGING — US US OB LIMITED
1 series · 14 of 22 positions shown · non-contrast
Comparison: none

CLINICAL DATA: Evaluate AFI.

EXAM:
LIMITED OBSTETRIC ULTRASOUND

[Series 1: us ob limited · 0.28mm/px · 14 of 22 slices shown]
[im 1/22]
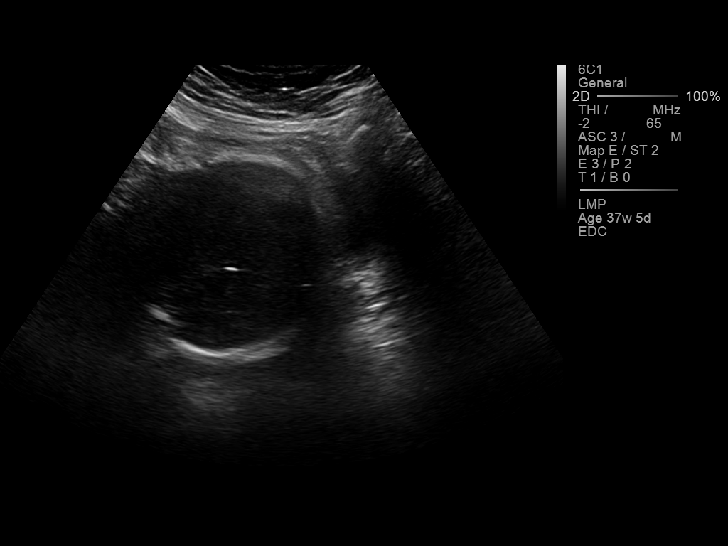
[im 3/22]
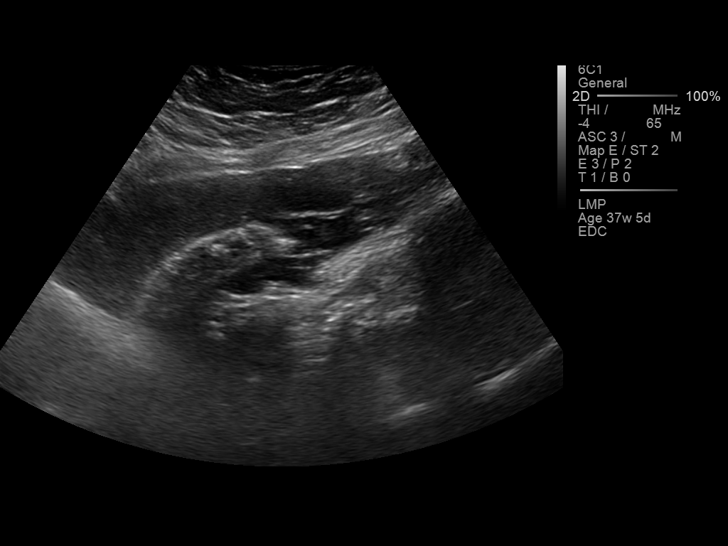
[im 4/22]
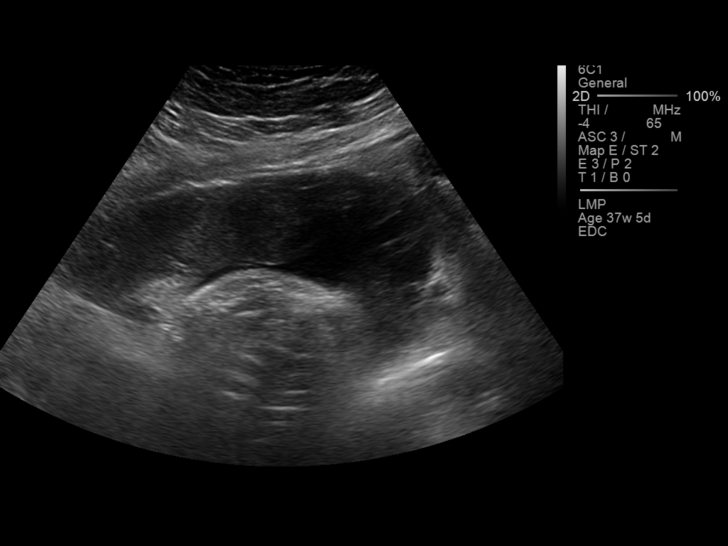
[im 6/22]
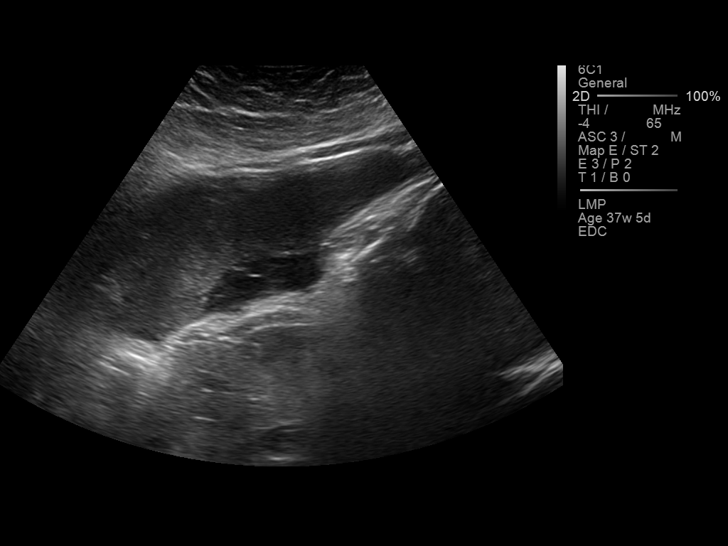
[im 8/22]
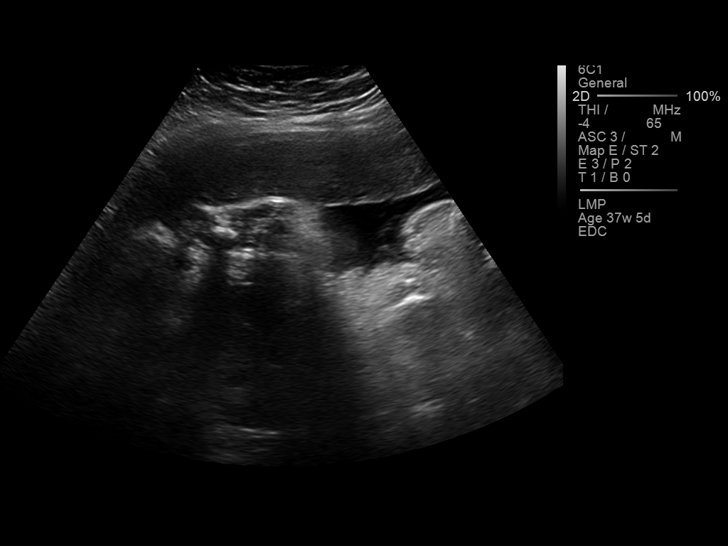
[im 9/22]
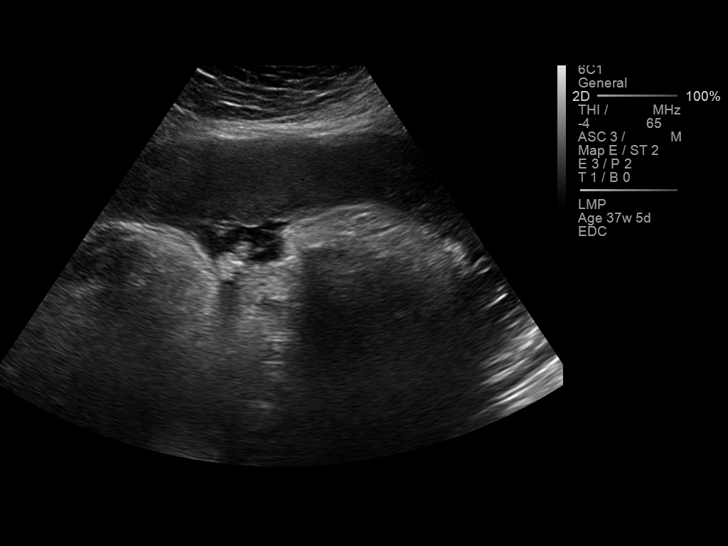
[im 11/22]
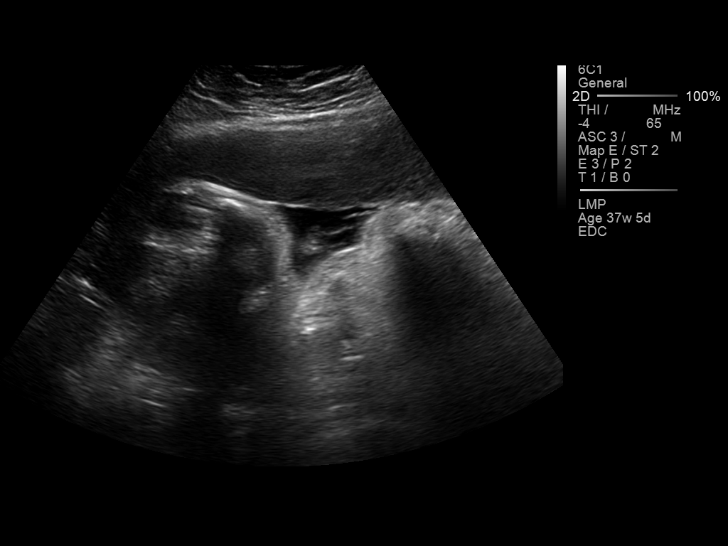
[im 12/22]
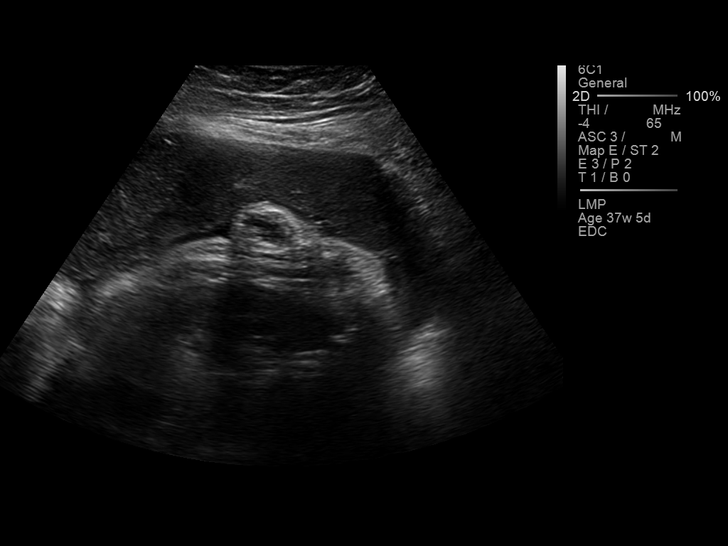
[im 14/22]
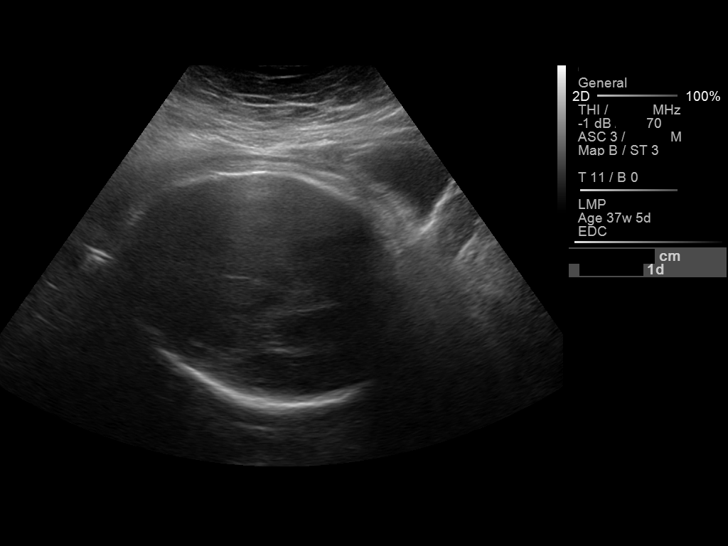
[im 15/22]
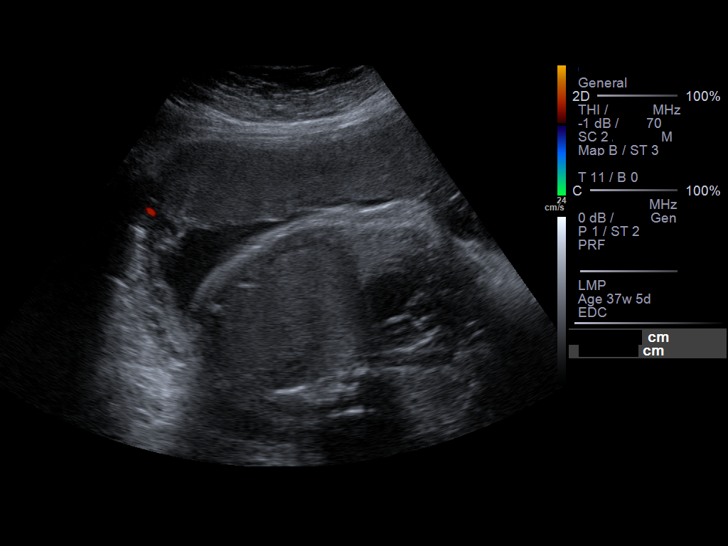
[im 17/22]
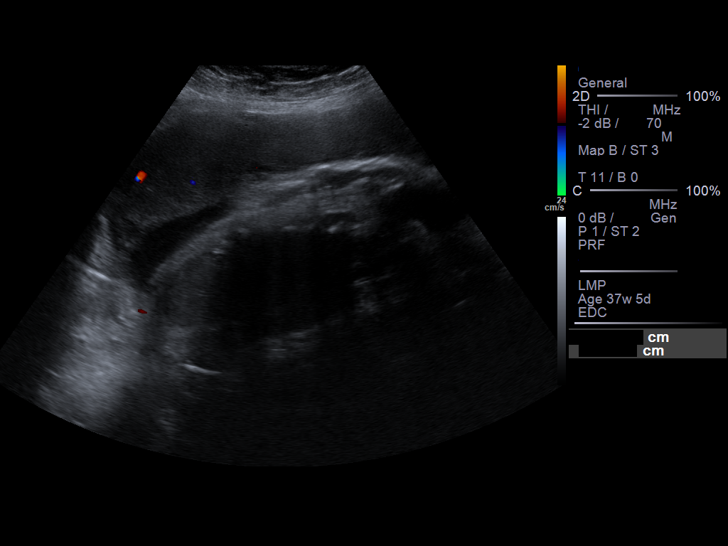
[im 19/22]
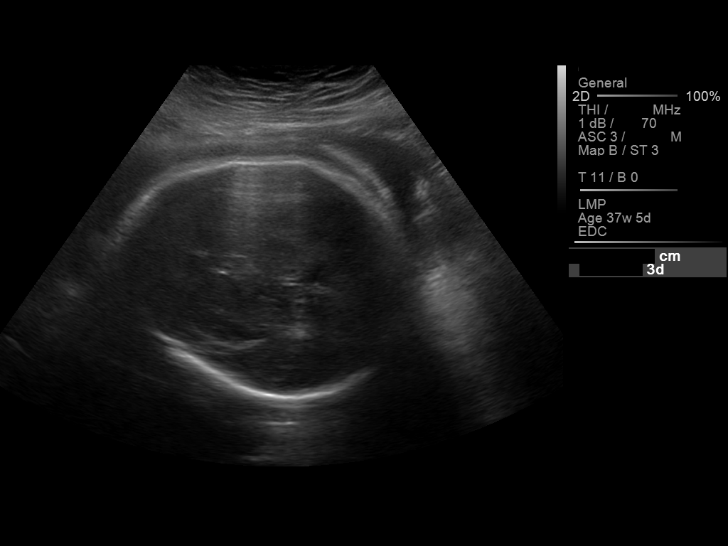
[im 20/22]
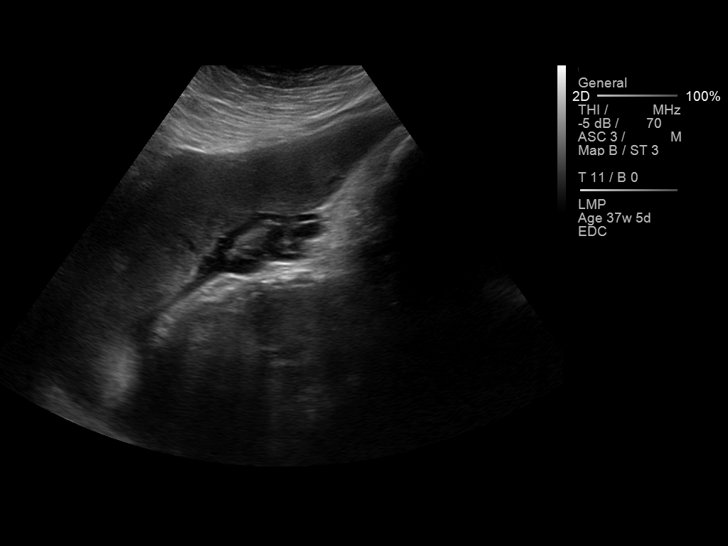
[im 22/22]
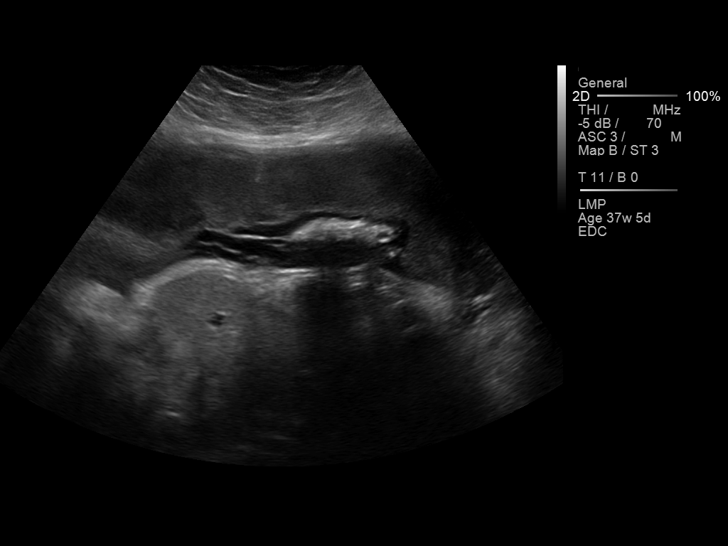

[14 of 22 positions shown; findings below may reference images not displayed]

FINDINGS: Number of Fetuses: 1

Heart Rate:  131 bpm

Movement: Yes

Presentation: Cephalic

Placental Location: Anterior

Previa: None

Amniotic Fluid (Subjective):  Within normal limits.

AFI 9.0. 7.9 cm 5th percentile and 24.9 cm 95th percentile for 35
weeks.

BPD:  8.5 cmcm 34w  2d

MATERNAL FINDINGS:

Cervix:  Appears closed.

Uterus/Adnexae:  No abnormality visualized.
IMPRESSION: Single viable intrauterine pregnancy at 34 weeks 2 days. Fetal heart
rate 131 beats per minute. AFI 9.0. Report phoned to patient's
caregiver at the time the study.

This exam is performed on an emergent basis and does not
comprehensively evaluate fetal size, dating, or anatomy; follow-up
complete OB US should be considered if further fetal assessment is
warranted.

## 2016-06-13 IMAGING — US US OB US >=[ID] SNGL FETUS
1 series · 14 of 28 positions shown · non-contrast
Comparison: none

CLINICAL DATA: Pregnancy.

EXAM:
ULTRASOUND OB >=5RERQ SINGLE FETUS

[Series 1: us ob us >=(id) sngl fetus · 0.21mm/px · 14 of 87 slices shown]
[im 4/87]
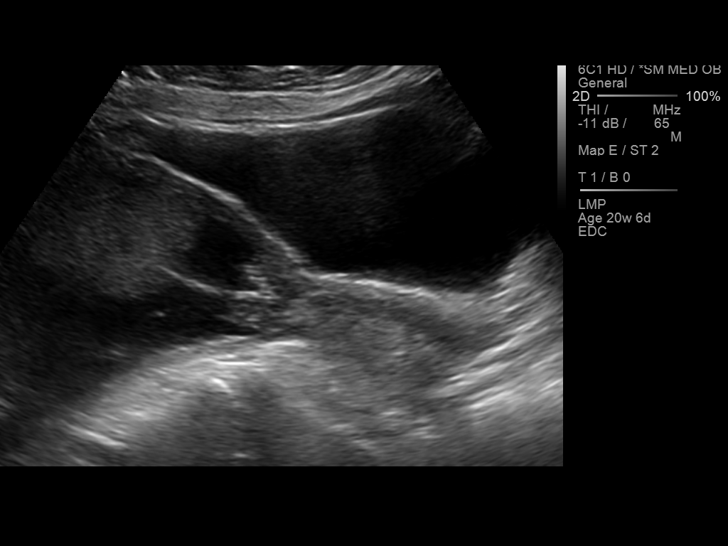
[im 10/87]
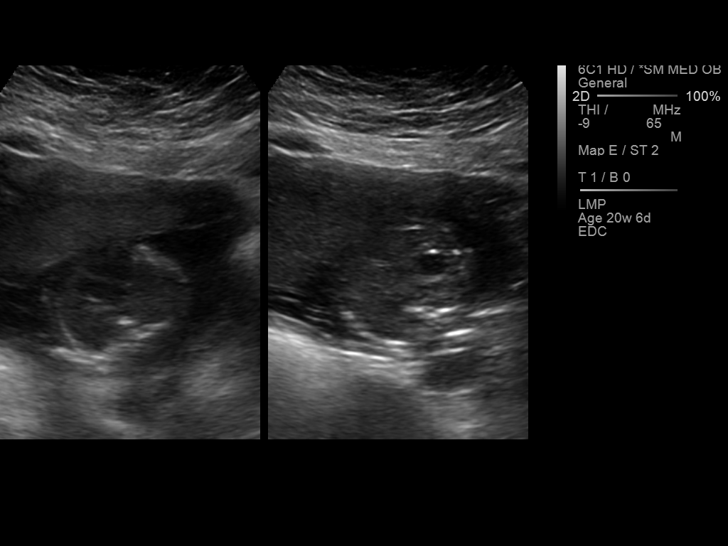
[im 16/87]
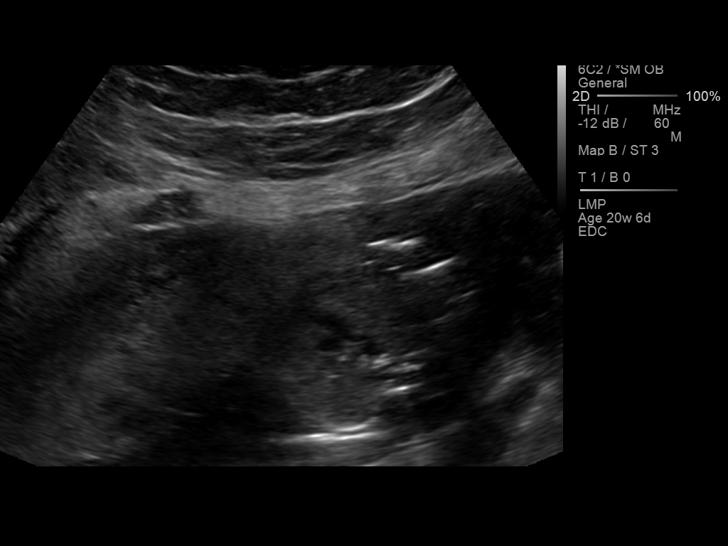
[im 23/87]
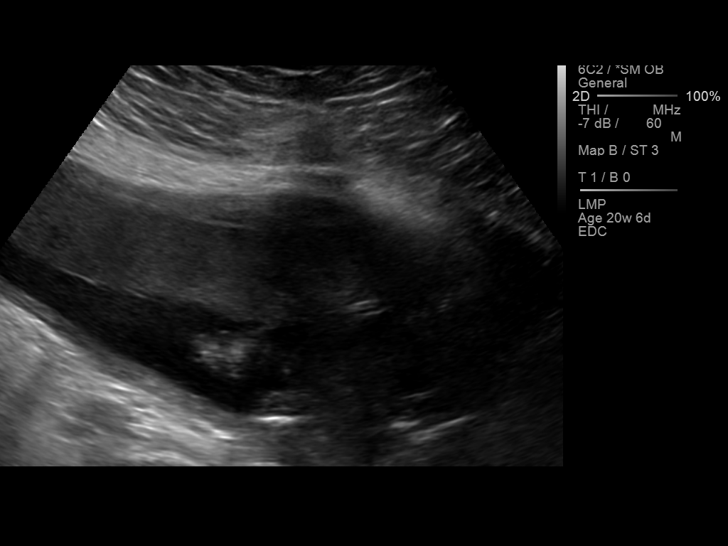
[im 29/87]
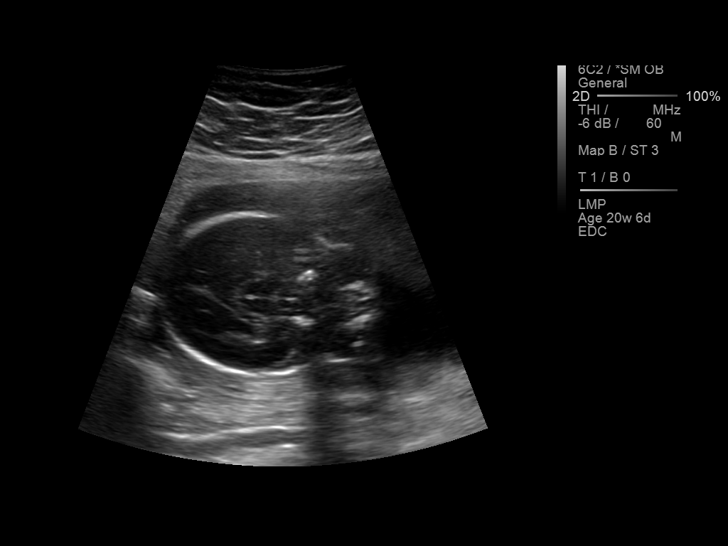
[im 36/87]
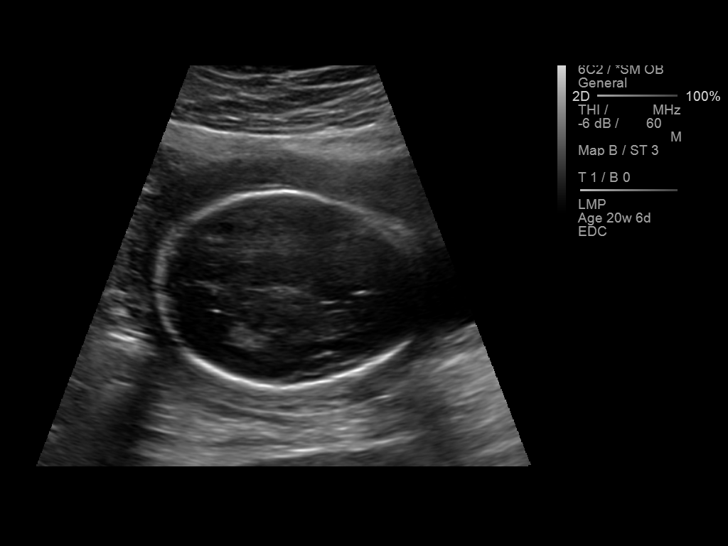
[im 42/87]
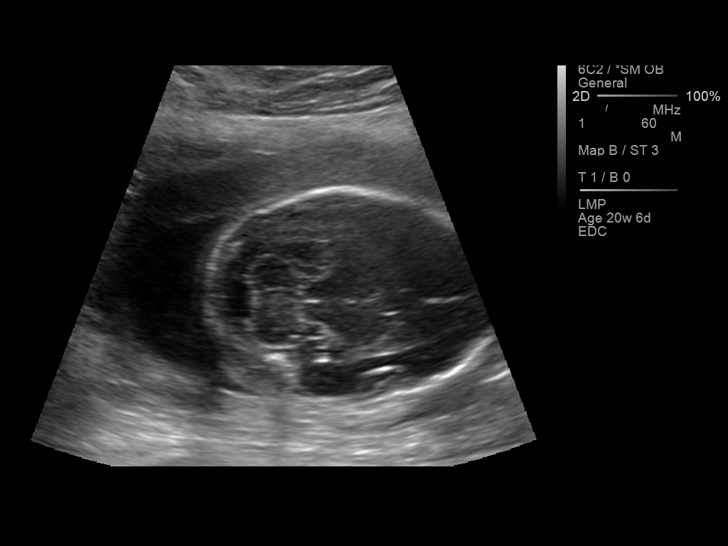
[im 48/87]
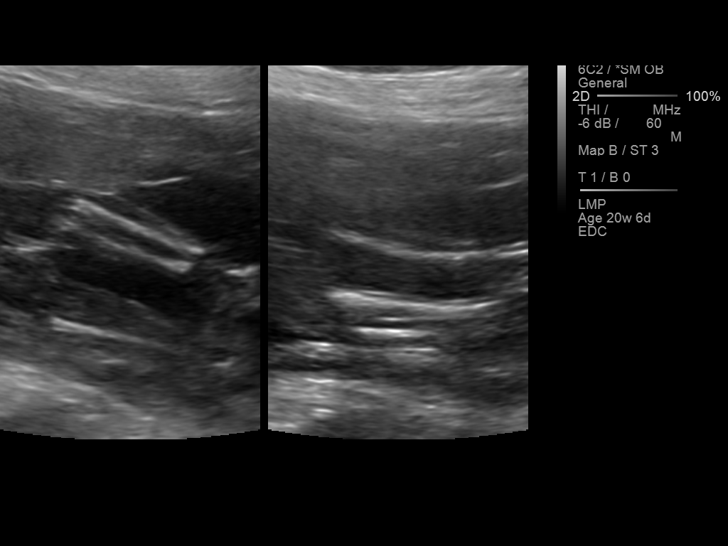
[im 55/87]
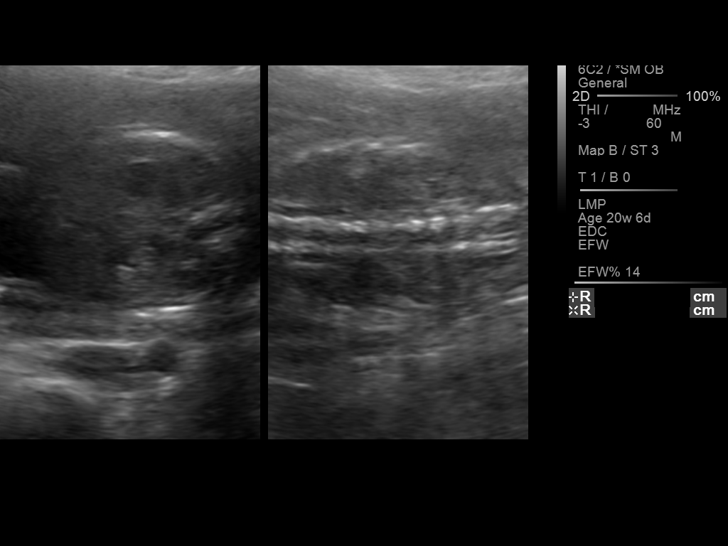
[im 61/87]
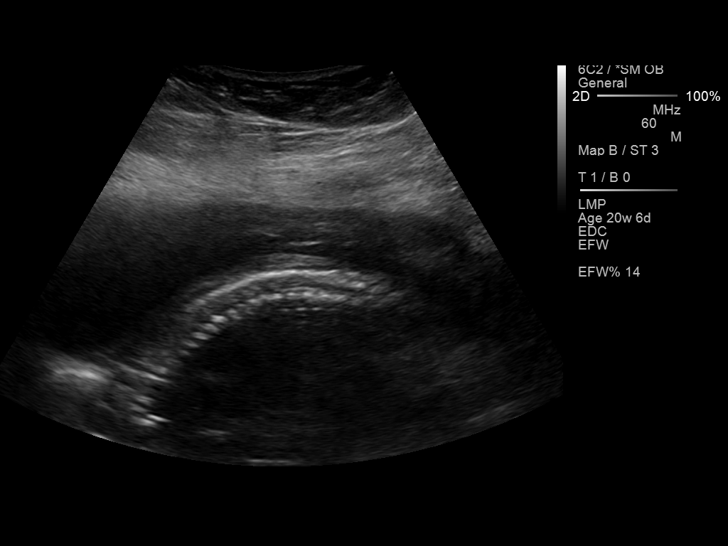
[im 67/87]
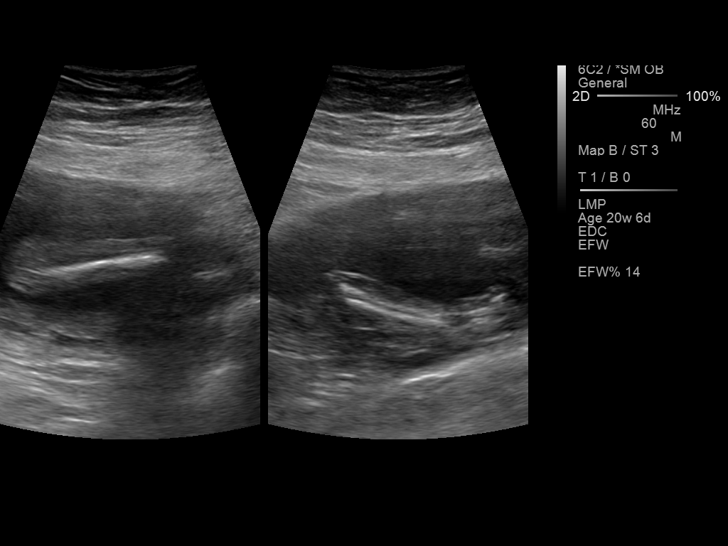
[im 74/87]
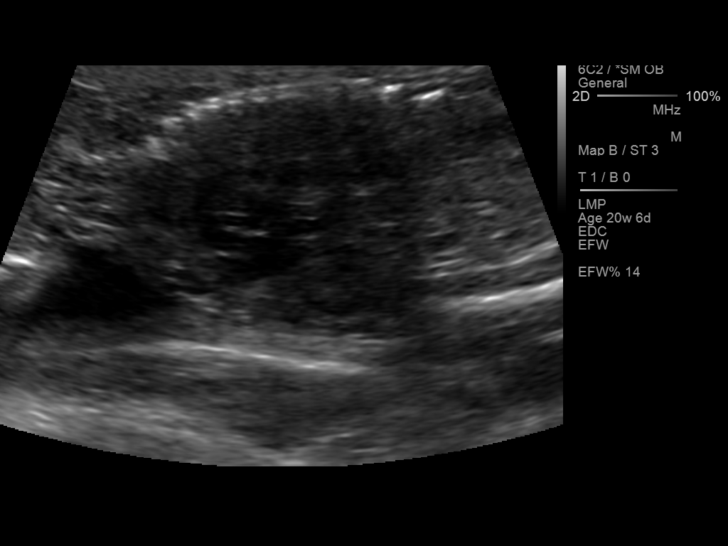
[im 80/87]
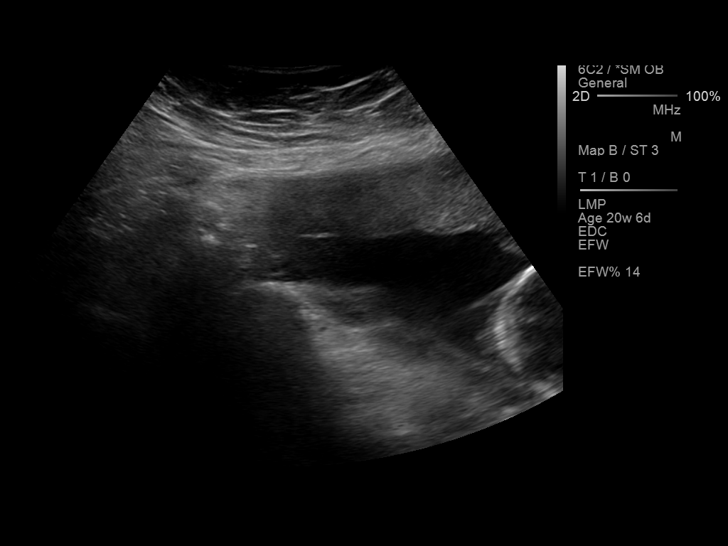
[im 87/87]
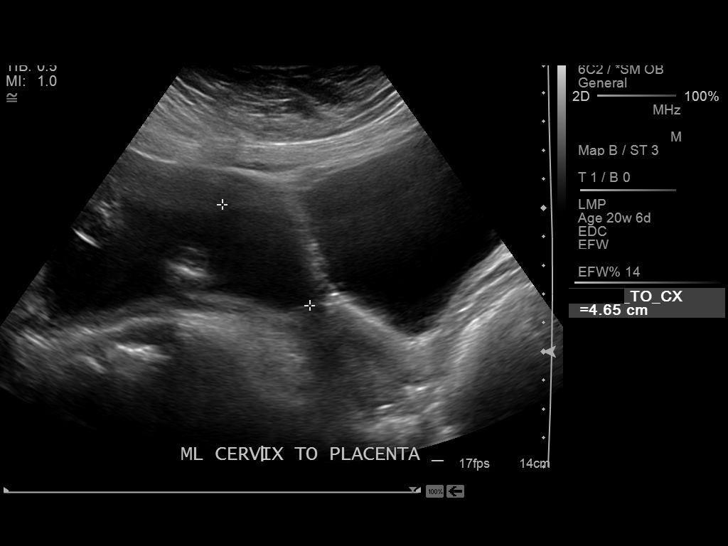

[14 of 28 positions shown; findings below may reference images not displayed]

FINDINGS: Number of Fetuses: 1

Heart Rate:  136 bpm

Movement: Present

Presentation: Breech

Previa: No

Placental Location: Anterior, 4.7 cm from the internal cervical os.

Amniotic Fluid (Subjective): Normal

Vertical pocket 3.4cm

FETAL BIOMETRY

BPD:  4.5cm 19w 3d

HC:    17.2cm  19w   5d

AC:   14.7cm  20w   0d

FL:   3.4cm  20w   3d

Current Mean GA: 19w 6d              US EDC: 04/10/2015

Estimated Fetal Weight:  335g    14%ile

FETAL ANATOMY

Lateral Ventricles: Visualized

Thalami/CSP: Visualized

Posterior Fossa:  Visualized

Nuchal Region: Visualized    NFT= 2.4 mmmm

Upper Lip: Visualized

Spine: Visualized

4 Chamber Heart on Left: Visualized

LVOT: Visualized

RVOT: Visualized

Stomach on Left: Visualized

3 Vessel Cord: Visualize

Cord Insertion site: Visualized

Kidneys: Visualized

Bladder: Visualized

Extremities: Visualized

Maternal Findings:

Cervix:  4.5 cm and closed
IMPRESSION: Single viable intrauterine pregnancy in breech presentation at 19
weeks 6 days.

## 2018-07-18 ENCOUNTER — Ambulatory Visit: Payer: Self-pay | Attending: Oncology | Admitting: *Deleted

## 2018-07-18 ENCOUNTER — Encounter (INDEPENDENT_AMBULATORY_CARE_PROVIDER_SITE_OTHER): Payer: Self-pay

## 2018-07-18 ENCOUNTER — Encounter: Payer: Self-pay | Admitting: *Deleted

## 2018-07-18 ENCOUNTER — Encounter: Payer: Self-pay | Admitting: Obstetrics and Gynecology

## 2018-07-18 VITALS — BP 106/75 | HR 80 | Temp 97.5°F | Ht 62.0 in | Wt 252.0 lb

## 2018-07-18 DIAGNOSIS — R87612 Low grade squamous intraepithelial lesion on cytologic smear of cervix (LGSIL): Secondary | ICD-10-CM

## 2018-07-18 NOTE — Progress Notes (Signed)
32 year old Hispanic female referred to [facility] by the [facility] for financial assistance for further evaluation of recent abnormal pap of HPV+ LGSIL completed on 06/21/18.  Orson SlickJacqui, the interpreter is present during the interview.  Patient states she has had an abnormal pap smear in the past and is familiar with colpocopy.  She is scheduled to see Dr. Logan BoresEvans at [facility] next Tuesday for colposcopy and possible biopsy.  Hank-out given on "Understanding Pap Smears" and HPV and Cervical Cancer.  Patient has been screened for eligibility.  She does not have any insurance, Medicare or Medicaid.  She also meets financial eligibility.  Hand-out given on the Affordable Care Act.  Will follow-up per [facility] protocol and per Dr. Logan BoresEvans.

## 2018-07-26 ENCOUNTER — Ambulatory Visit (INDEPENDENT_AMBULATORY_CARE_PROVIDER_SITE_OTHER): Payer: Self-pay | Admitting: Obstetrics and Gynecology

## 2018-07-26 ENCOUNTER — Encounter: Payer: Self-pay | Admitting: Obstetrics and Gynecology

## 2018-07-26 VITALS — BP 126/85 | HR 78 | Wt 249.0 lb

## 2018-07-26 DIAGNOSIS — N72 Inflammatory disease of cervix uteri: Secondary | ICD-10-CM

## 2018-07-26 DIAGNOSIS — R87612 Low grade squamous intraepithelial lesion on cytologic smear of cervix (LGSIL): Secondary | ICD-10-CM

## 2018-07-26 DIAGNOSIS — B977 Papillomavirus as the cause of diseases classified elsewhere: Secondary | ICD-10-CM

## 2018-07-26 NOTE — Progress Notes (Signed)
Pt here today for colposcopy, with interpreter.

## 2018-07-26 NOTE — Progress Notes (Signed)
Referring Prov ider:  [facility]  HPI:  Jenny Brooks is a 32 y.o.  [phone]  who presents today for evaluation and management of abnormal cervical cytology.    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  Patient does describe a remote history of previous abnormal Pap with colposcopy. ROS:  Pertinent items are noted in HPI.  OB History  Gravida Para Term Preterm AB Living  4 4 4  0 0 4  SAB TAB Ectopic Multiple Live Births  0 0 0 0 4    # Outcome Date GA Lbr Len/2nd Weight Sex Delivery Anes PTL Lv  4 Term 03/27/15 3231w0d / 1800:16 5 lb 15.9 oz (2.72 kg) F VBAC Other  LIV  3 Term 05/14/06   5 lb (2.268 kg) M Vag-Spont   LIV  2 Term 12/12/04   5 lb 7 oz (2.466 kg) F Vag-Spont   LIV  1 Term 12/12/03   5 lb 6.4 oz (2.449 kg) F CS-LTranv   LIV    Obstetric Comments  History of Cesarean section in 2005 for breech; followed by 2 successful VBAC's    History of depression; no meds    Past Medical History:  Diagnosis Date  . Depression   . History of prior pregnancy with SGA newborn   . Obesity affecting pregnancy in third trimester     Past Surgical History:  Procedure Laterality Date  . CESAREAN SECTION  2005   For breech presentation    SOCIAL HISTORY: Social History   Substance and Sexual Activity  Alcohol Use No   Social History   Substance and Sexual Activity  Drug Use No     Family History  Problem Relation Age of Onset  . Heart disease Father   . Diabetes Neg Hx   . Cancer Neg Hx     ALLERGIES:  Patient has no known allergies.  She has a current medication list which includes the following prescription(s): ibuprofen, lanolin, and prenatal multivitamin.  Physical Exam: -Vitals:  There were no vitals taken for this visit. GEN: WD, WN, NAD.  A+ O x 3, good mood and affect. ABD:  NT, ND.  Soft, no masses.  No hernias noted.   Pelvic:   Vulva: Normal appearance.  No lesions.  Vagina: No lesions or abnormalities noted.    Support: Normal pelvic support.  Urethra No masses tenderness or scarring.  Meatus Normal size without lesions or prolapse.  Cervix: See below.  Anus: Normal exam.  No lesions.  Perineum: Normal exam.  No lesions.        Bimanual   Uterus: Normal size.  Non-tender.  Mobile.  AV.  Adnexae: No masses.  Non-tender to palpation.  Cul-de-sac: Negative for abnormality.   PROCEDURE: 1.  Urine Pregnancy Test:  not done 2.  Colposcopy performed with 4% acetic acid after verbal consent obtained                            -Aceto-white Lesions Location(s): 5 and 9 o'clock.              -Biopsy performed at 5 and 9 o'clock               -ECC indicated and performed: No.     -Biopsy sites made hemostatic with pressure and Monsel's solution   -Satisfactory colposcopy: Yes.      -Evidence of Invasive cervical CA :  NO  ASSESSMENT:  Jenny Brooks is a 31 y.o. [phone] here for No diagnosis found.Marland Kitchen  PLAN: 1.  I discussed the grading system of pap smears and HPV high risk viral types.  We will discuss management after colpo results return.  No orders of the defined types were placed in this encounter.          F/U  No follow-ups on file.  Brennan Bailey ,MD 07/26/2018,2:10 PM

## 2018-07-27 ENCOUNTER — Other Ambulatory Visit: Payer: Self-pay | Admitting: Obstetrics and Gynecology

## 2018-08-01 LAB — PATHOLOGY

## 2018-08-02 ENCOUNTER — Ambulatory Visit (INDEPENDENT_AMBULATORY_CARE_PROVIDER_SITE_OTHER): Payer: Self-pay | Admitting: Obstetrics and Gynecology

## 2018-08-02 ENCOUNTER — Encounter: Payer: Self-pay | Admitting: Obstetrics and Gynecology

## 2018-08-02 VITALS — BP 130/85 | HR 85 | Ht 62.0 in | Wt 250.0 lb

## 2018-08-02 DIAGNOSIS — N72 Inflammatory disease of cervix uteri: Secondary | ICD-10-CM

## 2018-08-02 DIAGNOSIS — B977 Papillomavirus as the cause of diseases classified elsewhere: Secondary | ICD-10-CM

## 2018-08-02 NOTE — Progress Notes (Signed)
HPI:      [name] is a 32 y.o. [phone] who LMP was Patient's last menstrual period was 07/21/2018 (exact date).  Subjective:   She presents today for follow-up of her colposcopy.  She had a Pap smear showing LGSIL with positive HPV.    Hx: The following portions of the patient's history were reviewed and updated as appropriate:             She  has a past medical history of Depression, History of prior pregnancy with SGA newborn, and Obesity affecting pregnancy in third trimester. She does not have any pertinent problems on file. She  has a past surgical history that includes Cesarean section (2005). Her family history includes Heart disease in her father. She  reports that she has never smoked. She does not have any smokeless tobacco history on file. She reports that she does not drink alcohol or use drugs. She has a current medication list which includes the following prescription(s): ibuprofen, prenatal multivitamin, and lanolin. She has No Known Allergies.       Review of Systems:  Review of Systems  Constitutional: Denied constitutional symptoms, night sweats, recent illness, fatigue, fever, insomnia and weight loss.  Eyes: Denied eye symptoms, eye pain, photophobia, vision change and visual disturbance.  Ears/Nose/Throat/Neck: Denied ear, nose, throat or neck symptoms, hearing loss, nasal discharge, sinus congestion and sore throat.  Cardiovascular: Denied cardiovascular symptoms, arrhythmia, chest pain/pressure, edema, exercise intolerance, orthopnea and palpitations.  Respiratory: Denied pulmonary symptoms, asthma, pleuritic pain, productive sputum, cough, dyspnea and wheezing.  Gastrointestinal: Denied, gastro-esophageal reflux, melena, nausea and vomiting.  Genitourinary: Denied genitourinary symptoms including symptomatic vaginal discharge, pelvic relaxation issues, and urinary complaints.  Musculoskeletal: Denied musculoskeletal symptoms, stiffness,  swelling, muscle weakness and myalgia.  Dermatologic: Denied dermatology symptoms, rash and scar.  Neurologic: Denied neurology symptoms, dizziness, headache, neck pain and syncope.  Psychiatric: Denied psychiatric symptoms, anxiety and depression.  Endocrine: Denied endocrine symptoms including hot flashes and night sweats.   Meds:   Current Outpatient Medications on File Prior to Visit  Medication Sig Dispense Refill  . ibuprofen (ADVIL,MOTRIN) 800 MG tablet Take 1 tablet (800 mg total) by mouth every 8 (eight) hours as needed. 60 tablet 1  . Prenatal Vit-Fe Fumarate-FA (PRENATAL MULTIVITAMIN) TABS tablet Take 1 tablet by mouth daily at 12 noon.    . lanolin OINT Apply 1 application topically as needed (for breast care). (Patient not taking: Reported on 07/26/2018) 7 g 1   No current facility-administered medications on file prior to visit.     Objective:     Vitals:   08/02/18 1414  BP: 130/85  Pulse: 85              Colposcopy results reviewed directly with the patient.  Assessment:    [id number] Patient Active Problem List   Diagnosis Date Noted  . Indication for care in labor and delivery, antepartum 03/27/2015  . Desires VBAC (vaginal birth after cesarean) trial 03/27/2015  . Normal labor 03/27/2015  . Labor and delivery, indication for care 02/18/2015  . SGA (small for gestational age) 02/11/2015  . NST (non-stress test) reactive 02/04/2015     1. High risk human papilloma virus (HPV) infection of cervix     No cytologic abnormalities are noted at colposcopy.  She did have acetowhite changes but biopsies revealed no abnormality.  Because a negative colposcopy with a positive Pap is unusual I have recommended a follow-up colposcopy performed in 6 months to  make sure that I am not missing anything and that there are no further dysplastic changes.   Plan:            1.  Negative cytology at colposcopy-positive HPV by previous Pap.  2.  With Pap and colposcopy  inconsistent recommend follow-up colposcopy in 6 months. Orders No orders of the defined types were placed in this encounter.   No orders of the defined types were placed in this encounter.  I spent 12 minutes involved in the care of this patient of which greater than 50% was spent discussing abnormal Pap smears with normal colposcopy, cytology versus HPV changes, future follow-ups with colposcopy and Paps as needed.  All questions answered.    F/U  Return in about 6 months (around 02/01/2019).  Elonda Huskyavid J. Sybol Morre, M.D. 08/02/2018 2:32 PM

## 2018-08-02 NOTE — Progress Notes (Signed)
Pt here today for colposcopy results.

## 2018-08-03 ENCOUNTER — Encounter: Payer: Self-pay | Admitting: *Deleted

## 2018-08-03 NOTE — Progress Notes (Signed)
Per Dr. Logan BoresEvans notes, patient is to follow up with him in 6 months for another colposcopy.  [facility] [place].

## 2018-08-22 ENCOUNTER — Emergency Department
Admission: EM | Admit: 2018-08-22 | Discharge: 2018-08-22 | Disposition: A | Discharge status: Home / Self Care | Payer: Worker's Compensation | Attending: Emergency Medicine | Admitting: Emergency Medicine

## 2018-08-22 ENCOUNTER — Encounter: Payer: Self-pay | Admitting: Emergency Medicine

## 2018-08-22 ENCOUNTER — Emergency Department: Payer: Worker's Compensation

## 2018-08-22 DIAGNOSIS — Y9389 Activity, other specified: Secondary | ICD-10-CM | POA: Diagnosis not present

## 2018-08-22 DIAGNOSIS — Z79899 Other long term (current) drug therapy: Secondary | ICD-10-CM | POA: Diagnosis not present

## 2018-08-22 DIAGNOSIS — W19XXXA Unspecified fall, initial encounter: Secondary | ICD-10-CM

## 2018-08-22 DIAGNOSIS — S93402A Sprain of unspecified ligament of left ankle, initial encounter: Secondary | ICD-10-CM | POA: Insufficient documentation

## 2018-08-22 DIAGNOSIS — S99912A Unspecified injury of left ankle, initial encounter: Secondary | ICD-10-CM | POA: Diagnosis present

## 2018-08-22 DIAGNOSIS — F329 Major depressive disorder, single episode, unspecified: Secondary | ICD-10-CM | POA: Insufficient documentation

## 2018-08-22 DIAGNOSIS — M25562 Pain in left knee: Secondary | ICD-10-CM

## 2018-08-22 DIAGNOSIS — Y929 Unspecified place or not applicable: Secondary | ICD-10-CM | POA: Insufficient documentation

## 2018-08-22 DIAGNOSIS — W11XXXA Fall on and from ladder, initial encounter: Secondary | ICD-10-CM | POA: Diagnosis not present

## 2018-08-22 DIAGNOSIS — Y990 Civilian activity done for income or pay: Secondary | ICD-10-CM | POA: Insufficient documentation

## 2018-08-22 MED ORDER — TRAMADOL HCL 50 MG PO TABS: 50.0000 mg | ORAL_TABLET | Freq: Two times a day (BID) | ORAL | 0 refills | Status: DC | PRN

## 2018-08-22 MED ORDER — IBUPROFEN 600 MG PO TABS: 600.0000 mg | ORAL_TABLET | Freq: Three times a day (TID) | ORAL | 0 refills | Status: DC | PRN

## 2018-08-22 NOTE — ED Provider Notes (Addendum)
[facility] Emergency Department Provider Note   ____________________________________________   First MD Initiated Contact with Patient 08/22/18 [phone]     (approximate)  I have reviewed the triage vital signs and the nursing notes.   HISTORY  Chief Complaint Fall    HPI Jenny Brooks is a 32 y.o. female patient complain of low back, left knee, left ankle pain secondary to a fall.  Patient states she fell off the bottom step of a ladder.  Incident occurred at work prior to arrival.  Patient state pain increased with ambulation.  Patient rates pain as a 9/10.  Patient described the pain is "achy".  No palliative measures prior to arrival.  Past Medical History:  Diagnosis Date  . Depression   . History of prior pregnancy with SGA newborn   . Obesity affecting pregnancy in third trimester     Patient Active Problem List   Diagnosis Date Noted  . Indication for care in labor and delivery, antepartum 03/27/2015  . Desires VBAC (vaginal birth after cesarean) trial 03/27/2015  . Normal labor 03/27/2015  . Labor and delivery, indication for care 02/18/2015  . SGA (small for gestational age) 02/11/2015  . NST (non-stress test) reactive 02/04/2015    Past Surgical History:  Procedure Laterality Date  . CESAREAN SECTION  2005   For breech presentation    Prior to Admission medications   Medication Sig Start Date End Date Taking? Authorizing Provider  ibuprofen (ADVIL,MOTRIN) 800 MG tablet Take 1 tablet (800 mg total) by mouth every 8 (eight) hours as needed. 03/29/15   Hildred Laserherry, Anika, MD  lanolin OINT Apply 1 application topically as needed (for breast care). Patient not taking: Reported on 07/26/2018 03/29/15   Hildred Laserherry, Anika, MD  Prenatal Vit-Fe Fumarate-FA (PRENATAL MULTIVITAMIN) TABS tablet Take 1 tablet by mouth daily at 12 noon.    [provider]    Allergies Patient has no known allergies.  Family History  Problem Relation  Age of Onset  . Heart disease Father   . Diabetes Neg Hx   . Cancer Neg Hx     Social History Social History   Tobacco Use  . Smoking status: Never Smoker  . Smokeless tobacco: Never Used  Substance Use Topics  . Alcohol use: No  . Drug use: No    Review of Systems  Constitutional: No fever/chills Eyes: No visual changes. ENT: No sore throat. Cardiovascular: Denies chest pain. Respiratory: Denies shortness of breath. Gastrointestinal: No abdominal pain.  No nausea, no vomiting.  No diarrhea.  No constipation. Genitourinary: Negative for dysuria. Musculoskeletal: Positive for back, left knee, and left ankle pain. Skin: Negative for rash. Neurological: Negative for headaches, focal weakness or numbness. Psychiatric:Depression  ____________________________________________   PHYSICAL EXAM:  VITAL SIGNS: ED Triage Vitals  Enc Vitals Group     BP 08/22/18 0617 124/69     Pulse Rate 08/22/18 0617 99     Resp 08/22/18 0617 18     Temp 08/22/18 0617 99.2 F (37.3 C)     Temp Source 08/22/18 0617 Oral     SpO2 08/22/18 0617 99 %     Weight 08/22/18 0614 249 lb (112.9 kg)     Height 08/22/18 0614 5\' 2"  (1.575 m)     Head Circumference --      Peak Flow --      Pain Score 08/22/18 0614 9     Pain Loc --      Pain Edu? --  Excl. in GC? --    Constitutional: Alert and oriented. Well appearing and in no acute distress.  Morbid obesity. Neck: No cervical spine tenderness to palpation. Cardiovascular: Normal rate, regular rhythm. Grossly normal heart sounds.  Good peripheral circulation. Respiratory: Normal respiratory effort.  No retractions. Lungs CTAB. Musculoskeletal: No obvious deformity to the lumbar spine, left knee, or left ankle.  Patient has left paraspinal muscle guarding.  Mild guarding palpation of the anterior patella and lateral ankle. Neurologic:  Normal speech and language. No gross focal neurologic deficits are appreciated. No gait instability. Skin:   Skin is warm, dry and intact. No rash noted. Psychiatric: Mood and affect are normal. Speech and behavior are normal.  ____________________________________________   LABS (all labs ordered are listed, but only abnormal results are displayed)  Labs Reviewed - No data to display ____________________________________________  EKG   ____________________________________________  RADIOLOGY  ED MD interpretation:    Official radiology report(s): Dg Ankle Complete Left  Result Date: 08/22/2018 CLINICAL DATA:  Pain following fall EXAM: LEFT ANKLE COMPLETE - 3+ VIEW COMPARISON:  None. FINDINGS: Frontal, oblique, and lateral views were obtained. No evident fracture or joint effusion. No joint space narrowing or erosion. There is a small inferior calcaneal spur. The ankle mortise appears intact. IMPRESSION: No fracture or appreciable arthropathy. Small inferior calcaneal spur. Ankle mortise appears intact Electronically Signed   By: Bretta BangWilliam  Woodruff III M.D.   On: 08/22/2018 07:04   Dg Knee Complete 4 Views Left  Result Date: 08/22/2018 CLINICAL DATA:  Pain following fall EXAM: LEFT KNEE - COMPLETE 4+ VIEW COMPARISON:  None. FINDINGS: Frontal, lateral, and bilateral oblique views were obtained. There is no fracture or dislocation. No joint effusion. There is no appreciable joint space narrowing. There is slight spurring medially and laterally. No erosive change. IMPRESSION: Rather minimal osteoarthritic change. No fracture or dislocation. No joint effusion. Electronically Signed   By: Bretta BangWilliam  Woodruff III M.D.   On: 08/22/2018 07:03    ____________________________________________   PROCEDURES  Procedure(s) performed: None  Procedures  Critical Care performed: No  ____________________________________________   INITIAL IMPRESSION / ASSESSMENT AND PLAN / ED COURSE  As part of my medical decision making, I reviewed the following data within the electronic MEDICAL RECORD NUMBER   Right  lumbar strain, left knee pain, and sprain left ankle secondary to fall.  Discussed x-ray findings with patient.  Patient placed in a Ace wrap and given crutches for ambulation.  Patient given discharge care instruction advised take medication as directed.  Patient given a work note for 3 days and advised to follow-up with her companies Worker's Compensation doctor if complaints persist.     ____________________________________________   FINAL CLINICAL IMPRESSION(S) / ED DIAGNOSES  Final diagnoses:  None     ED Discharge Orders    None       Note:  This document was prepared using Dragon voice recognition software and may include unintentional dictation errors.    Joni ReiningSmith, Jermery Caratachea K, PA-C 08/22/18 0724    Joni ReiningSmith, Adaline Trejos K, PA-C 08/22/18 0917    Joni ReiningSmith, Shalamar Crays K, PA-C 08/22/18 [id number]    Pershing ProudSchaevitz, Myra Rudeavid Matthew, MD 08/22/18 [phone]

## 2018-08-22 NOTE — ED Notes (Signed)
See triage note  Presents s/p fall   States she slipped at work and twisted left ankle  Min swelling noted  Good pulses

## 2018-08-22 NOTE — Discharge Instructions (Addendum)
Follow discharge care instructions and ambulate with crutches for 2 to 3 days as needed.

## 2018-08-22 NOTE — ED Triage Notes (Addendum)
Patient states that she was at work and tripped over a hose and fell off the bottom step of a ladder. Patient with complaint of lower back pain, left knee pain and left ankle pain. Triage complete using the video interpreter Aram BeechamCynthia [phone].

## 2019-02-03 ENCOUNTER — Encounter: Payer: Self-pay | Admitting: Obstetrics and Gynecology

## 2019-02-03 ENCOUNTER — Other Ambulatory Visit: Payer: Self-pay

## 2019-02-03 ENCOUNTER — Other Ambulatory Visit: Payer: Self-pay | Admitting: Obstetrics and Gynecology

## 2019-02-03 ENCOUNTER — Ambulatory Visit (INDEPENDENT_AMBULATORY_CARE_PROVIDER_SITE_OTHER): Payer: Self-pay | Admitting: Obstetrics and Gynecology

## 2019-02-03 VITALS — BP 110/76 | HR 84 | Wt 238.8 lb

## 2019-02-03 DIAGNOSIS — R87612 Low grade squamous intraepithelial lesion on cytologic smear of cervix (LGSIL): Secondary | ICD-10-CM

## 2019-02-03 DIAGNOSIS — B977 Papillomavirus as the cause of diseases classified elsewhere: Secondary | ICD-10-CM

## 2019-02-03 DIAGNOSIS — N72 Inflammatory disease of cervix uteri: Secondary | ICD-10-CM

## 2019-02-03 NOTE — Progress Notes (Signed)
HPI:  Jenny Brooks is a 33 y.o.  [phone]  who presents today for evaluation and management of abnormal cervical cytology.    Dysplasia History: History of LGSIL and positive HPV based on Pap, but previously negative colposcopy  ROS:  Pertinent items are noted in HPI.  OB History  Gravida Para Term Preterm AB Living  4 4 4  0 0 4  SAB TAB Ectopic Multiple Live Births  0 0 0 0 4    # Outcome Date GA Lbr Len/2nd Weight Sex Delivery Anes PTL Lv  4 Term 03/27/15 [redacted]w[redacted]d / 1800:16 5 lb 15.9 oz (2.72 kg) F VBAC Other  LIV  3 Term 05/14/06   5 lb (2.268 kg) M Vag-Spont   LIV  2 Term 12/12/04   5 lb 7 oz (2.466 kg) F Vag-Spont   LIV  1 Term 12/12/03   5 lb 6.4 oz (2.449 kg) F CS-LTranv   LIV    Obstetric Comments  History of Cesarean section in 2005 for breech; followed by 2 successful VBAC's    History of depression; no meds    Past Medical History:  Diagnosis Date  . Depression   . History of prior pregnancy with SGA newborn   . Obesity affecting pregnancy in third trimester     Past Surgical History:  Procedure Laterality Date  . CESAREAN SECTION  2005   For breech presentation    SOCIAL HISTORY: Social History   Substance and Sexual Activity  Alcohol Use No   Social History   Substance and Sexual Activity  Drug Use No     Family History  Problem Relation Age of Onset  . Heart disease Father   . Diabetes Neg Hx   . Cancer Neg Hx     ALLERGIES:  Patient has no known allergies.  She currently has no medications in their medication list.  Physical Exam: -Vitals:  BP 110/76   Pulse 84   Wt 238 lb 12.8 oz (108.3 kg)   LMP 01/29/2019   BMI 43.68 kg/m  GEN: WD, WN, NAD.  A+ O x 3, good mood and affect. ABD:  NT, ND.  Soft, no masses.  No hernias noted.   Pelvic:   Vulva: Normal appearance.  No lesions.  Vagina: No lesions or abnormalities noted.  Support: Normal pelvic support.  Urethra No masses tenderness or scarring.  Meatus Normal size  without lesions or prolapse.  Cervix: See below.  Anus: Normal exam.  No lesions.  Perineum: Normal exam.  No lesions.        Bimanual   Uterus: Normal size.  Non-tender.  Mobile.  AV.  Adnexae: No masses.  Non-tender to palpation.  Cul-de-sac: Negative for abnormality.   PROCEDURE: 1.  Urine Pregnancy Test:   2.  Colposcopy performed with 4% acetic acid after verbal consent obtained                         Large cervical ectropion    -Aceto-white Lesions Location(s): 2 o'clock.              -Biopsy performed at 2 o'clock               -ECC indicated and performed: No.     -Biopsy sites made hemostatic with pressure and Monsel's solution   -Satisfactory colposcopy: Yes.      -Evidence of Invasive cervical CA :  NO  ASSESSMENT:  Jenny Brooks is  a 33 y.o. [id number] here for  1. High risk human papilloma virus (HPV) infection of cervix   2. Low grade squamous intraepithelial lesion on cytologic smear of cervix (LGSIL)   .  PLAN: 1.  I discussed the grading system of pap smears and HPV high risk viral types.  We will discuss management after colpo results return.  No orders of the defined types were placed in this encounter.          F/U  Return for We will contact her with any abnormal test results.  Brennan Baileyavid Evans ,MD 02/03/2019,9:37 AM

## 2019-02-03 NOTE — Progress Notes (Signed)
Patient comes in today for colposcopy. 

## 2019-02-08 LAB — PATHOLOGY
